# Patient Record
Sex: Female | Born: 1960
Health system: Southern US, Community
[De-identification: ages and names within clinical notes are randomized; demographics above are authoritative.]

## PROBLEM LIST (undated history)

## (undated) DIAGNOSIS — Z87448 Personal history of other diseases of urinary system: Secondary | ICD-10-CM

## (undated) DIAGNOSIS — K219 Gastro-esophageal reflux disease without esophagitis: Secondary | ICD-10-CM

## (undated) DIAGNOSIS — I10 Essential (primary) hypertension: Secondary | ICD-10-CM

## (undated) DIAGNOSIS — M129 Arthropathy, unspecified: Secondary | ICD-10-CM

## (undated) DIAGNOSIS — L03119 Cellulitis of unspecified part of limb: Secondary | ICD-10-CM

## (undated) DIAGNOSIS — E538 Deficiency of other specified B group vitamins: Secondary | ICD-10-CM

## (undated) DIAGNOSIS — R7303 Prediabetes: Secondary | ICD-10-CM

## (undated) DIAGNOSIS — IMO0002 Reserved for concepts with insufficient information to code with codable children: Secondary | ICD-10-CM

## (undated) DIAGNOSIS — M549 Dorsalgia, unspecified: Secondary | ICD-10-CM

## (undated) DIAGNOSIS — Z87898 Personal history of other specified conditions: Secondary | ICD-10-CM

## (undated) DIAGNOSIS — L02419 Cutaneous abscess of limb, unspecified: Secondary | ICD-10-CM

## (undated) DIAGNOSIS — D509 Iron deficiency anemia, unspecified: Secondary | ICD-10-CM

## (undated) HISTORY — DX: Cellulitis of unspecified part of limb: L03.119

## (undated) HISTORY — DX: Essential (primary) hypertension: I10

## (undated) HISTORY — PX: BILATERAL OOPHORECTOMY: SHX1221

## (undated) HISTORY — DX: Dorsalgia, unspecified: M54.9

## (undated) HISTORY — DX: Arthropathy, unspecified: M12.9

## (undated) HISTORY — DX: Reserved for concepts with insufficient information to code with codable children: IMO0002

## (undated) HISTORY — DX: Cutaneous abscess of limb, unspecified: L02.419

## (undated) HISTORY — DX: Gastro-esophageal reflux disease without esophagitis: K21.9

## (undated) HISTORY — DX: Deficiency of other specified B group vitamins: E53.8

## (undated) HISTORY — DX: Prediabetes: R73.03

## (undated) HISTORY — DX: Iron deficiency anemia, unspecified: D50.9

## (undated) HISTORY — DX: Personal history of other specified conditions: Z87.898

## (undated) HISTORY — DX: Personal history of other diseases of urinary system: Z87.448

## (undated) HISTORY — PX: CHOLECYSTECTOMY: SHX55

---

## 1992-08-16 HISTORY — PX: ABDOMINAL HYSTERECTOMY: SHX81

## 1998-06-13 ENCOUNTER — Emergency Department (HOSPITAL_COMMUNITY): Admission: EM | Admit: 1998-06-13 | Discharge: 1998-06-13 | Payer: Self-pay | Admitting: Emergency Medicine

## 1998-06-13 ENCOUNTER — Encounter: Payer: Self-pay | Admitting: Emergency Medicine

## 2000-03-11 ENCOUNTER — Encounter: Payer: Self-pay | Admitting: Family Medicine

## 2000-03-11 ENCOUNTER — Ambulatory Visit (HOSPITAL_COMMUNITY): Admission: RE | Admit: 2000-03-11 | Discharge: 2000-03-11 | Payer: Self-pay | Admitting: Family Medicine

## 2000-03-25 ENCOUNTER — Other Ambulatory Visit: Admission: RE | Admit: 2000-03-25 | Discharge: 2000-03-25 | Payer: Self-pay | Admitting: Internal Medicine

## 2001-11-08 ENCOUNTER — Ambulatory Visit (HOSPITAL_COMMUNITY): Admission: RE | Admit: 2001-11-08 | Discharge: 2001-11-08 | Payer: Self-pay | Admitting: Family Medicine

## 2001-11-08 ENCOUNTER — Encounter: Payer: Self-pay | Admitting: Family Medicine

## 2003-05-02 ENCOUNTER — Ambulatory Visit (HOSPITAL_COMMUNITY): Admission: RE | Admit: 2003-05-02 | Discharge: 2003-05-02 | Payer: Self-pay | Admitting: Family Medicine

## 2003-05-02 ENCOUNTER — Encounter: Payer: Self-pay | Admitting: Family Medicine

## 2004-03-07 ENCOUNTER — Emergency Department (HOSPITAL_COMMUNITY): Admission: EM | Admit: 2004-03-07 | Discharge: 2004-03-07 | Payer: Self-pay | Admitting: Emergency Medicine

## 2005-11-29 ENCOUNTER — Other Ambulatory Visit: Admission: RE | Admit: 2005-11-29 | Discharge: 2005-11-29 | Payer: Self-pay | Admitting: Family Medicine

## 2007-11-13 ENCOUNTER — Ambulatory Visit (HOSPITAL_COMMUNITY): Admission: RE | Admit: 2007-11-13 | Discharge: 2007-11-13 | Payer: Self-pay | Admitting: Family Medicine

## 2008-01-10 ENCOUNTER — Ambulatory Visit: Payer: Self-pay | Admitting: Internal Medicine

## 2008-01-10 LAB — CONVERTED CEMR LAB
ALT: 35 units/L (ref 0–35)
AST: 29 units/L (ref 0–37)
BUN: 11 mg/dL (ref 6–23)
Basophils Absolute: 0 10*3/uL (ref 0.0–0.1)
Basophils Relative: 1.1 % — ABNORMAL HIGH (ref 0.0–1.0)
CO2: 31 meq/L (ref 19–32)
Calcium: 9.4 mg/dL (ref 8.4–10.5)
Chloride: 103 meq/L (ref 96–112)
Cholesterol: 165 mg/dL (ref 0–200)
Creatinine, Ser: 0.8 mg/dL (ref 0.4–1.2)
Eosinophils Relative: 2.3 % (ref 0.0–5.0)
Glucose, Bld: 110 mg/dL — ABNORMAL HIGH (ref 70–99)
Hemoglobin: 12 g/dL (ref 12.0–15.0)
Ketones, ur: NEGATIVE mg/dL
Lymphocytes Relative: 60.5 % — ABNORMAL HIGH (ref 12.0–46.0)
MCHC: 31.8 g/dL (ref 30.0–36.0)
Monocytes Relative: 2.5 % — ABNORMAL LOW (ref 3.0–12.0)
Mucus, UA: NEGATIVE
Neutro Abs: 1.2 10*3/uL — ABNORMAL LOW (ref 1.4–7.7)
Neutrophils Relative %: 33.6 % — ABNORMAL LOW (ref 43.0–77.0)
RBC: 4.94 M/uL (ref 3.87–5.11)
Specific Gravity, Urine: 1.025 (ref 1.000–1.03)
TSH: 1.72 microintl units/mL (ref 0.35–5.50)
Total Bilirubin: 1 mg/dL (ref 0.3–1.2)
Total CHOL/HDL Ratio: 2.8
Total Protein: 7 g/dL (ref 6.0–8.3)
Triglycerides: 64 mg/dL (ref 0–149)
Urobilinogen, UA: 0.2 (ref 0.0–1.0)
WBC: 3.6 10*3/uL — ABNORMAL LOW (ref 4.5–10.5)
pH: 5.5 (ref 5.0–8.0)

## 2008-01-15 ENCOUNTER — Ambulatory Visit: Payer: Self-pay | Admitting: Internal Medicine

## 2008-01-15 ENCOUNTER — Encounter (INDEPENDENT_AMBULATORY_CARE_PROVIDER_SITE_OTHER): Payer: Self-pay | Admitting: *Deleted

## 2008-01-15 DIAGNOSIS — M129 Arthropathy, unspecified: Secondary | ICD-10-CM

## 2008-01-15 DIAGNOSIS — I10 Essential (primary) hypertension: Secondary | ICD-10-CM

## 2008-01-15 DIAGNOSIS — Z87898 Personal history of other specified conditions: Secondary | ICD-10-CM

## 2008-01-15 DIAGNOSIS — Z87448 Personal history of other diseases of urinary system: Secondary | ICD-10-CM

## 2008-01-15 DIAGNOSIS — D509 Iron deficiency anemia, unspecified: Secondary | ICD-10-CM

## 2008-01-15 HISTORY — DX: Personal history of other specified conditions: Z87.898

## 2008-01-15 HISTORY — DX: Arthropathy, unspecified: M12.9

## 2008-01-15 HISTORY — DX: Essential (primary) hypertension: I10

## 2008-01-15 HISTORY — DX: Personal history of other diseases of urinary system: Z87.448

## 2008-01-15 HISTORY — DX: Iron deficiency anemia, unspecified: D50.9

## 2008-01-18 ENCOUNTER — Encounter: Payer: Self-pay | Admitting: Internal Medicine

## 2008-01-18 LAB — CONVERTED CEMR LAB
Transferrin: 276.9 mg/dL (ref >=212.0)
Vitamin B-12: 187 pg/mL — ABNORMAL LOW (ref 211–911)

## 2008-01-24 ENCOUNTER — Ambulatory Visit: Payer: Self-pay | Admitting: Internal Medicine

## 2008-02-26 ENCOUNTER — Ambulatory Visit: Payer: Self-pay | Admitting: Internal Medicine

## 2008-03-28 ENCOUNTER — Ambulatory Visit: Payer: Self-pay | Admitting: Internal Medicine

## 2008-04-25 ENCOUNTER — Ambulatory Visit: Payer: Self-pay | Admitting: Internal Medicine

## 2008-04-25 DIAGNOSIS — L02419 Cutaneous abscess of limb, unspecified: Secondary | ICD-10-CM

## 2008-04-25 DIAGNOSIS — L03119 Cellulitis of unspecified part of limb: Secondary | ICD-10-CM

## 2008-04-25 HISTORY — DX: Cellulitis of unspecified part of limb: L03.119

## 2008-04-25 HISTORY — DX: Cutaneous abscess of limb, unspecified: L02.419

## 2008-04-29 ENCOUNTER — Ambulatory Visit: Payer: Self-pay | Admitting: Internal Medicine

## 2008-05-16 HISTORY — PX: OTHER SURGICAL HISTORY: SHX169

## 2008-06-03 ENCOUNTER — Ambulatory Visit: Payer: Self-pay | Admitting: Internal Medicine

## 2008-06-03 ENCOUNTER — Telehealth (INDEPENDENT_AMBULATORY_CARE_PROVIDER_SITE_OTHER): Payer: Self-pay | Admitting: *Deleted

## 2008-06-11 ENCOUNTER — Ambulatory Visit (HOSPITAL_BASED_OUTPATIENT_CLINIC_OR_DEPARTMENT_OTHER): Admission: RE | Admit: 2008-06-11 | Discharge: 2008-06-11 | Payer: Self-pay | Admitting: Orthopedic Surgery

## 2008-06-11 ENCOUNTER — Encounter (INDEPENDENT_AMBULATORY_CARE_PROVIDER_SITE_OTHER): Payer: Self-pay | Admitting: Orthopedic Surgery

## 2008-07-04 ENCOUNTER — Ambulatory Visit: Payer: Self-pay | Admitting: Internal Medicine

## 2008-07-25 ENCOUNTER — Ambulatory Visit: Payer: Self-pay | Admitting: Internal Medicine

## 2008-08-12 ENCOUNTER — Ambulatory Visit: Payer: Self-pay | Admitting: Internal Medicine

## 2008-09-12 ENCOUNTER — Ambulatory Visit: Payer: Self-pay | Admitting: Internal Medicine

## 2008-10-09 ENCOUNTER — Ambulatory Visit: Payer: Self-pay | Admitting: Internal Medicine

## 2008-10-29 ENCOUNTER — Telehealth (INDEPENDENT_AMBULATORY_CARE_PROVIDER_SITE_OTHER): Payer: Self-pay | Admitting: *Deleted

## 2008-11-11 ENCOUNTER — Ambulatory Visit: Payer: Self-pay | Admitting: Internal Medicine

## 2008-11-14 ENCOUNTER — Ambulatory Visit (HOSPITAL_COMMUNITY): Admission: RE | Admit: 2008-11-14 | Discharge: 2008-11-14 | Payer: Self-pay | Admitting: Internal Medicine

## 2008-12-11 ENCOUNTER — Ambulatory Visit: Payer: Self-pay | Admitting: Internal Medicine

## 2009-01-08 ENCOUNTER — Ambulatory Visit: Payer: Self-pay | Admitting: Internal Medicine

## 2009-02-07 ENCOUNTER — Ambulatory Visit: Payer: Self-pay | Admitting: Internal Medicine

## 2009-02-07 DIAGNOSIS — M549 Dorsalgia, unspecified: Secondary | ICD-10-CM

## 2009-02-07 HISTORY — DX: Dorsalgia, unspecified: M54.9

## 2009-02-10 ENCOUNTER — Encounter: Payer: Self-pay | Admitting: Internal Medicine

## 2009-02-10 LAB — CONVERTED CEMR LAB
AST: 18 units/L (ref 0–37)
Alkaline Phosphatase: 79 units/L (ref 39–117)
BUN: 16 mg/dL (ref 6–23)
Basophils Absolute: 0 10*3/uL (ref 0.0–0.1)
Bilirubin Urine: NEGATIVE
Bilirubin, Direct: 0.1 mg/dL (ref 0.0–0.3)
CO2: 33 meq/L — ABNORMAL HIGH (ref 19–32)
Chloride: 105 meq/L (ref 96–112)
HCT: 35 % — ABNORMAL LOW (ref 36.0–46.0)
HDL: 45.6 mg/dL (ref >39.00)
Ketones, ur: NEGATIVE mg/dL
LDL Cholesterol: 60 mg/dL (ref 0–99)
Leukocytes, UA: NEGATIVE
MCHC: 32.4 g/dL (ref 30.0–36.0)
MCV: 77.2 fL — ABNORMAL LOW (ref 78.0–100.0)
Neutro Abs: 2.6 10*3/uL (ref 1.4–7.7)
Nitrite: NEGATIVE
Potassium: 3.4 meq/L — ABNORMAL LOW (ref 3.5–5.1)
RBC: 4.54 M/uL (ref 3.87–5.11)
TSH: 1.5 microintl units/mL (ref 0.35–5.50)
Total Bilirubin: 0.8 mg/dL (ref 0.3–1.2)
Total CHOL/HDL Ratio: 3
VLDL: 36 mg/dL (ref 0.0–40.0)
WBC: 5.7 10*3/uL (ref 4.5–10.5)

## 2009-04-14 ENCOUNTER — Telehealth: Payer: Self-pay | Admitting: Internal Medicine

## 2009-04-16 ENCOUNTER — Encounter: Payer: Self-pay | Admitting: Internal Medicine

## 2009-06-18 ENCOUNTER — Ambulatory Visit: Payer: Self-pay | Admitting: Internal Medicine

## 2009-07-13 ENCOUNTER — Emergency Department (HOSPITAL_COMMUNITY): Admission: EM | Admit: 2009-07-13 | Discharge: 2009-07-13 | Payer: Self-pay | Admitting: Emergency Medicine

## 2009-11-21 ENCOUNTER — Ambulatory Visit (HOSPITAL_COMMUNITY): Admission: RE | Admit: 2009-11-21 | Discharge: 2009-11-21 | Payer: Self-pay | Admitting: Internal Medicine

## 2010-03-06 ENCOUNTER — Telehealth: Payer: Self-pay | Admitting: Internal Medicine

## 2010-03-26 ENCOUNTER — Ambulatory Visit: Payer: Self-pay | Admitting: Internal Medicine

## 2010-03-26 ENCOUNTER — Telehealth: Payer: Self-pay | Admitting: Internal Medicine

## 2010-03-26 DIAGNOSIS — E538 Deficiency of other specified B group vitamins: Secondary | ICD-10-CM

## 2010-03-26 HISTORY — DX: Deficiency of other specified B group vitamins: E53.8

## 2010-09-09 ENCOUNTER — Other Ambulatory Visit: Payer: Self-pay | Admitting: Internal Medicine

## 2010-09-09 ENCOUNTER — Ambulatory Visit
Admission: RE | Admit: 2010-09-09 | Discharge: 2010-09-09 | Payer: Self-pay | Source: Home / Self Care | Attending: Internal Medicine | Admitting: Internal Medicine

## 2010-09-09 LAB — CBC WITH DIFFERENTIAL/PLATELET
Basophils Relative: 1 % (ref 0.0–3.0)
Eosinophils Absolute: 0.1 10*3/uL (ref 0.0–0.7)
HCT: 36.8 % (ref 36.0–46.0)
Hemoglobin: 12.1 g/dL (ref 12.0–15.0)
Lymphs Abs: 1.9 10*3/uL (ref 0.7–4.0)
MCHC: 33 g/dL (ref 30.0–36.0)
MCV: 76 fl — ABNORMAL LOW (ref 78.0–100.0)
Monocytes Absolute: 0.3 10*3/uL (ref 0.1–1.0)
Neutro Abs: 1.6 10*3/uL (ref 1.4–7.7)
Neutrophils Relative %: 41.9 % — ABNORMAL LOW (ref 43.0–77.0)
RBC: 4.84 Mil/uL (ref 3.87–5.11)

## 2010-09-09 LAB — BASIC METABOLIC PANEL
CO2: 32 mEq/L (ref 19–32)
Chloride: 104 mEq/L (ref 96–112)
Creatinine, Ser: 0.8 mg/dL (ref 0.4–1.2)
Potassium: 3.8 mEq/L (ref 3.5–5.1)

## 2010-09-09 LAB — HEPATIC FUNCTION PANEL
ALT: 22 U/L (ref 0–35)
Albumin: 4.2 g/dL (ref 3.5–5.2)
Total Protein: 7.2 g/dL (ref 6.0–8.3)

## 2010-09-09 LAB — URINALYSIS, ROUTINE W REFLEX MICROSCOPIC
Ketones, ur: NEGATIVE
Specific Gravity, Urine: 1.02 (ref 1.000–1.030)
Urine Glucose: NEGATIVE
pH: 5.5 (ref 5.0–8.0)

## 2010-09-09 LAB — TSH: TSH: 1.68 u[IU]/mL (ref 0.35–5.50)

## 2010-09-09 LAB — LIPID PANEL
Cholesterol: 163 mg/dL (ref 0–200)
Triglycerides: 50 mg/dL (ref 0.0–149.0)

## 2010-09-17 ENCOUNTER — Ambulatory Visit: Admit: 2010-09-17 | Payer: Self-pay | Admitting: Internal Medicine

## 2010-09-17 ENCOUNTER — Other Ambulatory Visit: Payer: Self-pay

## 2010-09-17 NOTE — Letter (Signed)
Summary: Forms/NCDMV  Forms/NCDMV   Imported By: Sherian Rein 03/30/2010 14:39:07  _____________________________________________________________________  External Attachment:    Type:   Image     Comment:   External Document

## 2010-09-17 NOTE — Progress Notes (Signed)
----   Converted from flag ---- ---- 03/26/2010 12:39 PM, Corwin Levins MD wrote: I would prefer her to have the shots monthly, but she could hold a few months if she were to have insurance in the future  ---- 03/26/2010 8:49 AM, Zella Ball Ewing CMA (AAMA) wrote: Patient wants to know if you want her in the future to continue B-12 shots. She had one in November. I told her I would call her after speaking to you about this. She did not have insurance and is why she did not continue. ------------------------------  called pt left msg. to call back    Patient called back informed to schedule monthly B-12 shots once she has insurance, patient agreed to do so.

## 2010-09-17 NOTE — Progress Notes (Signed)
Summary: BP med  ---- Converted from flag ---- ---- 03/06/2010 11:14 AM, Verdell Face wrote: [name]  (Dec 23, 2060) Pt filled out a walk-in sheet. She was admitted to [facility] hospital for a bad virus. Dr.,at hospital changed blood pressure meds to more affordable brands (Hydrochlorothiazide 25 mg,Lisinopril 20 mg) Pt cannot make appt until end of September but needs a refill for next month.  [facility] [employer], Elnita Maxwell ------------------------------  Phone Note Call from Patient Call back at [employer] [phone]   Caller: Patient Summary of Call: Okay to change BP and give 30 day with 1 refill? I will advise pt that there will be no additional refill until CPX. Initial call taken by: Margaret Pyle, CMA,  March 06, 2010 11:49 AM  Follow-up for Phone Call        ok with me Follow-up by: Corwin Levins MD,  March 06, 2010 12:46 PM  Additional Follow-up for Phone Call Additional follow up Details #1::        Pt's spouse informed of Rxs and that pt will need to sch CPX for further refills. Additional Follow-up by: Margaret Pyle, CMA,  March 06, 2010 3:34 PM    New/Updated Medications: LISINOPRIL 20 MG TABS (LISINOPRIL) 1 tab by mouth once daily HYDROCHLOROTHIAZIDE 25 MG TABS (HYDROCHLOROTHIAZIDE) 1 tab by mouth once daily Prescriptions: HYDROCHLOROTHIAZIDE 25 MG TABS (HYDROCHLOROTHIAZIDE) 1 tab by mouth once daily  #30 x 1   Entered by:   Margaret Pyle, CMA   Authorized by:   Corwin Levins MD   Signed by:   Margaret Pyle, CMA on 03/06/2010   Method used:   Electronically to        [facility] [phone]* (retail)       [address]       [place], [place]  [address]       Ph: [phone]       Fax: [phone]   RxID:   [id number] LISINOPRIL 20 MG TABS (LISINOPRIL) 1 tab by mouth once daily  #30 x 1   Entered by:   Margaret Pyle, CMA   Authorized by:   Corwin Levins MD   Signed by:   Margaret Pyle, CMA on 03/06/2010   Method used:   Electronically to        [facility] [phone]* (retail)       [address]       [place], [place]  [address]       Ph: [phone]       Fax: [phone]   RxID:   [id number]

## 2010-09-17 NOTE — Assessment & Plan Note (Signed)
Summary: ov---paperwork---stc   Vital Signs:  Patient profile:   50 year old female Height:      63 inches Weight:      198.75 pounds BMI:     35.33 O2 Sat:      99 % on Room air Temp:     98 degrees F oral Pulse rate:   58 / minute BP sitting:   110 / 68  (left arm) Cuff size:   large  Vitals Entered By: Zella Ball Ewing CMA Duncan Dull) (March 26, 2010 8:10 AM)  O2 Flow:  Room air  Preventive Care Screening  Mammogram:    Next Due:  11/2010  CC: followup, complete paperwork/RE   CC:  followup and complete paperwork/RE.  History of Present Illness: hospd in june 2011 for HTN urgency in [facility]; since then on more affordable med after losing job and unable to afford BP meds;Pt denies CP, sob, doe, wheezing, orthopnea, pnd, worsening LE edema, palps, dizziness or syncope  Pt denies new neuro symptoms such as headache, facial or extremity weakness  No fever, wt loss, night sweats, loss of appetite or other constitutional symptoms   Preventive Screening-Counseling & Management      Drug Use:  no.    Problems Prior to Update: 1)  Preventive Health Care  (ICD-V70.0) 2)  Back Pain  (ICD-724.5) 3)  Cellulitis, Leg, Left  (ICD-682.6) 4)  Anemia-iron Deficiency  (ICD-280.9) 5)  Preventive Health Care  (ICD-V70.0) 6)  Uti's, Hx of  (ICD-V13.00) 7)  Headaches, Hx of  (ICD-V13.8) 8)  Arthritis  (ICD-716.90) 9)  Hypertension  (ICD-401.9)  Medications Prior to Update: 1)  Lisinopril 20 Mg Tabs (Lisinopril) .Marland Kitchen.. 1 Tab By Mouth Once Daily 2)  Amlodipine Besylate 5 Mg Tabs (Amlodipine Besylate) .Marland Kitchen.. 1 By Mouth Once Daily 3)  Meloxicam 15 Mg Tabs (Meloxicam) .Marland Kitchen.. 1 By Mouth Once Daily 4)  Aspir-Low 81 Mg Tbec (Aspirin) .Marland Kitchen.. 1 By Mouth Once Daily 5)  Klor-Con 10 10 Meq Cr-Tabs (Potassium Chloride) .Marland Kitchen.. 1 By Mouth Once Daily 6)  Hydrochlorothiazide 25 Mg Tabs (Hydrochlorothiazide) .Marland Kitchen.. 1 Tab By Mouth Once Daily  Current Medications (verified): 1)  Lisinopril 20 Mg Tabs (Lisinopril)  .Marland Kitchen.. 1 Tab By Mouth Once Daily 2)  Klor-Con 10 10 Meq Cr-Tabs (Potassium Chloride) .Marland Kitchen.. 1 By Mouth Once Daily 3)  Hydrochlorothiazide 25 Mg Tabs (Hydrochlorothiazide) .Marland Kitchen.. 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 Hypertension Anemia-iron deficiency right heel spur lumbar DDD  Past Surgical History: Reviewed history from 07/25/2008 and no changes required. Hysterectomy - fibroids - 1994 Oophorectomy - bilat s/p arthroscopic right knee surgury 10/09 - dr Renae Fickle  Family History: Reviewed history from 01/15/2008 and no changes required. father with RA, HTN, DM, stroke mother with OA, HTN, DM, heart disease  aunt with "stomach" cancer  Social History: Reviewed history from 01/15/2008 and no changes required. 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   Head:  normocephalic and atraumatic.   Eyes:  vision grossly intact, pupils equal, and pupils  round.   Ears:  R ear normal and L ear normal.   Nose:  no external deformity and no nasal discharge.   Mouth:  no gingival abnormalities and pharynx pink and moist.   Neck:  supple and no masses.   Lungs:  normal respiratory effort and normal breath sounds.   Heart:  normal rate and regular rhythm.   Extremities:  no edema, no erythema    Impression & Recommendations:  Problem # 1:  HYPERTENSION (ICD-401.9)  The following medications were removed from the medication list:    Amlodipine Besylate 5 Mg Tabs (Amlodipine besylate) .Marland Kitchen... 1 by mouth  once daily Her updated medication list for this problem includes:    Lisinopril 20 Mg Tabs (Lisinopril) .Marland Kitchen... 1 tab by mouth once daily    Hydrochlorothiazide 25 Mg Tabs (Hydrochlorothiazide) .Marland Kitchen... 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 1 tab by mouth once daily 2)  Klor-con 10 10 Meq Cr-tabs (Potassium chloride) .Marland Kitchen.. 1 by mouth once daily 3)  Hydrochlorothiazide 25 Mg Tabs (Hydrochlorothiazide) .Marland Kitchen.. 1 tab by mouth once daily  Patient Instructions: 1)  Continue all previous medications as before this visit  2)  Your form was filled out today 3)  Please schedule a follow-up appointment in 6 months wtih CPX labs Prescriptions: HYDROCHLOROTHIAZIDE 25 MG TABS (HYDROCHLOROTHIAZIDE) 1 tab by mouth once daily  #90 x 3   Entered and Authorized by:   Corwin Levins MD   Signed by:   Corwin Levins MD on 03/26/2010   Method used:   Print then Give to Patient   RxID:   [id number] KLOR-CON 10 10 MEQ CR-TABS (POTASSIUM CHLORIDE) 1 by mouth once daily  #90 x 3   Entered and Authorized by:   Corwin Levins MD   Signed by:   Corwin Levins MD on 03/26/2010   Method used:   Print then Give to Patient   RxID:   [id number] LISINOPRIL 20 MG TABS (LISINOPRIL) 1 tab by mouth once daily  #90 x 3   Entered and Authorized by:   Corwin Levins MD   Signed by:   Corwin Levins MD on 03/26/2010   Method used:   Print then Give to Patient   RxID:   [id number]

## 2010-09-24 ENCOUNTER — Encounter: Payer: Self-pay | Admitting: Internal Medicine

## 2010-09-24 ENCOUNTER — Ambulatory Visit (INDEPENDENT_AMBULATORY_CARE_PROVIDER_SITE_OTHER): Payer: BC Managed Care – PPO | Admitting: Internal Medicine

## 2010-09-24 DIAGNOSIS — Z136 Encounter for screening for cardiovascular disorders: Secondary | ICD-10-CM

## 2010-09-24 DIAGNOSIS — K219 Gastro-esophageal reflux disease without esophagitis: Secondary | ICD-10-CM

## 2010-09-24 DIAGNOSIS — E538 Deficiency of other specified B group vitamins: Secondary | ICD-10-CM

## 2010-09-24 DIAGNOSIS — Z0000 Encounter for general adult medical examination without abnormal findings: Secondary | ICD-10-CM

## 2010-09-24 HISTORY — DX: Gastro-esophageal reflux disease without esophagitis: K21.9

## 2010-10-01 NOTE — Assessment & Plan Note (Signed)
Summary: 6 MOS FU/#CD   Vital Signs:  Patient profile:   50 year old female Height:      66 inches Weight:      204.25 pounds BMI:     33.09 O2 Sat:      97 % on Room air Temp:     98.1 degrees F oral Pulse rate:   74 / minute BP sitting:   102 / 70  (left arm) Cuff size:   large  Vitals Entered By: Zella Ball Ewing CMA Duncan Dull) (September 24, 2010 9:52 AM)  O2 Flow:  Room air  Preventive Care Screening     declines flu shot  CC: Yearly/RE   CC:  Yearly/RE.  History of Present Illness: here for wellness, overall doing well, Pt denies CP, worsening sob, doe, wheezing, orthopnea, pnd, worsening LE edema, palps, dizziness or syncope  Pt denies new neuro symptoms such as headache, facial or extremity weakness  Pt denies polydipsia, polyuria   Overall good compliance with meds, trying to follow low chol diet, wt stable, little excercise however  No fever, wt loss, night sweats, loss of appetite or other constitutional symptoms  Overall good compliance with meds, and good tolerability.  Denies worsening depressive symptoms, suicidal ideation, or panic.   Pt states good ability with ADL's, low fall risk, home safety reviewed and adequate, no significant change in hearing or vision, trying to follow lower chol diet, and occasionally active only with regular excercise.   Did have hospn for CP in [place] recently with flare of GERD, better now with dietary precuations, but stil wtih occasional flare, but no cough, hoarseness, dysphgia, abd pain, wt loss, or bowel change, or blood.   Problems Prior to Update: 1)  Vitamin B12 Deficiency  (ICD-266.2) 2)  Preventive Health Care  (ICD-V70.0) 3)  Back Pain  (ICD-724.5) 4)  Cellulitis, Leg, Left  (ICD-682.6) 5)  Anemia-iron Deficiency  (ICD-280.9) 6)  Preventive Health Care  (ICD-V70.0) 7)  Uti's, Hx of  (ICD-V13.00) 8)  Headaches, Hx of  (ICD-V13.8) 9)  Arthritis  (ICD-716.90) 10)  Hypertension  (ICD-401.9)  Medications Prior to Update: 1)   Lisinopril 20 Mg Tabs (Lisinopril) .Marland Kitchen.. 1 Tab By Mouth Once Daily 2)  Klor-Con 10 10 Meq Cr-Tabs (Potassium Chloride) .Marland Kitchen.. 1 By Mouth Once Daily 3)  Hydrochlorothiazide 25 Mg Tabs (Hydrochlorothiazide) .Marland Kitchen.. 1 Tab By Mouth Once Daily 4)  Cyanocobalamin 1000 Mcg/ml Soln (Cyanocobalamin) .Marland Kitchen.. 1 Cc Im Q Mo  Current Medications (verified): 1)  Lisinopril 20 Mg Tabs (Lisinopril) .Marland Kitchen.. 1 Tab By Mouth Once Daily 2)  Klor-Con 10 10 Meq Cr-Tabs (Potassium Chloride) .Marland Kitchen.. 1 By Mouth Once Daily 3)  Hydrochlorothiazide 25 Mg Tabs (Hydrochlorothiazide) .Marland Kitchen.. 1 Tab By Mouth Once Daily 4)  Cyanocobalamin 1000 Mcg/ml Soln (Cyanocobalamin) .Marland Kitchen.. 1 Cc Im Q Mo 5)  Omeprazole 20 Mg Cpdr (Omeprazole) .Marland Kitchen.. 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 father with RA, HTN, DM, stroke mother with OA, HTN, DM, heart disease, renal disease  aunt with "stomach" cancer  Social History: Reviewed history from 03/26/2010 and no changes required. bus driver for private students Married 2 children Never Smoked Alcohol use-no Drug use-no  Review of Systems  The patient denies anorexia, fever, vision loss, decreased hearing, hoarseness, chest pain, syncope, dyspnea on exertion, peripheral edema, prolonged cough, headaches,  hemoptysis, abdominal pain, melena, hematochezia, severe indigestion/heartburn, hematuria, incontinence, suspicious skin lesions, transient blindness, difficulty walking, depression, unusual weight change, abnormal bleeding, enlarged lymph nodes, and angioedema.         all otherwise negative per pt -    Physical Exam  General:  alert and overweight-appearing.   Head:  normocephalic and atraumatic.   Eyes:  vision grossly intact, pupils equal, and pupils round.   Ears:  R ear normal and L ear normal.   Nose:  no external deformity and no nasal discharge.   Mouth:  no gingival abnormalities and pharynx pink and moist.   Neck:  supple and no masses.   Lungs:  normal respiratory effort and normal breath sounds.   Heart:  normal rate and regular rhythm.   Abdomen:  soft, non-tender, and normal bowel sounds.   Msk:  no joint tenderness and no joint swelling. , lumbar and paravertebral nontender Extremities:  no edema, no erythema  Neurologic:  cranial nerves II-XII intact, strength normal in all extremities, and sensation intact to light touch.   Skin:  color normal and no rashes.   Psych:  not anxious appearing and not depressed appearing.     Impression & Recommendations:  Problem # 1:  Preventive Health Care (ICD-V70.0) Overall doing well, age appropriate education and counseling updated, referral for preventive services and immunizations addressed, dietary counseling and smoking status adressed , most recent labs reviewed, ecg reviewed I have personally reviewed and have noted 1.The patient's medical and social history 2.Their use of alcohol, tobacco or illicit drugs 3.Their current medications and supplements 4. Functional ability including ADL's, fall risk, home safety risk, hearing & visual impairment  5.Diet and physical activities 6.Evidence for depression or mood disorders The patients weight, height, BMI  have been recorded in the chart I have made referrals, counseling and  provided education to the patient based review of the above  Orders: EKG w/ Interpretation (93000) Gastroenterology Referral (GI)  Problem # 2:  GERD (ICD-530.81)  Her updated medication list for this problem includes:    Omeprazole 20 Mg Cpdr (Omeprazole) .Marland Kitchen... 1po once daily treat as above, f/u any worsening signs or symptoms   Labs Reviewed: Hgb: 12.1 (09/09/2010)   Hct: 36.8 (09/09/2010)  Problem # 3:  HYPERTENSION (ICD-401.9)  Orders: EKG w/ Interpretation (93000) Gastroenterology Referral (GI)  Her updated medication list for this problem includes:    Lisinopril 20 Mg Tabs (Lisinopril) .Marland Kitchen... 1 tab by mouth once daily    Hydrochlorothiazide 25 Mg Tabs (Hydrochlorothiazide) .Marland Kitchen... 1 tab by mouth once daily  BP today: 102/70 Prior BP: 110/68 (03/26/2010)  Labs Reviewed: K+: 3.8 (09/09/2010) Creat: : 0.8 (09/09/2010)   Chol: 163 (09/09/2010)   HDL: 57.10 (09/09/2010)   LDL: 96 (09/09/2010)   TG: 50.0 (09/09/2010) stable overall by hx and exam, ok to continue meds/tx as is   Complete Medication List: 1)  Lisinopril 20 Mg Tabs (Lisinopril) .Marland Kitchen.. 1 tab by mouth once daily 2)  Klor-con 10 10 Meq Cr-tabs (Potassium chloride) .Marland Kitchen.. 1 by mouth once daily  3)  Hydrochlorothiazide 25 Mg Tabs (Hydrochlorothiazide) .Marland Kitchen.. 1 tab by mouth once daily 4)  Cyanocobalamin 1000 Mcg/ml Soln (Cyanocobalamin) .Marland Kitchen.. 1 cc im q mo 5)  Omeprazole 20 Mg Cpdr (Omeprazole) .Marland Kitchen.. 1po once daily  Other Orders: Vit B12 1000 mcg (J3420) Admin of Therapeutic Inj  intramuscular or subcutaneous (13244)  Patient Instructions: 1)  you had the b12 shot today 2)  You will be contacted about the referral(s) to: colonoscopy 3)  Please take all new medications as prescribed - the generic for prilosec for reflux 4)  Continue all previous medications as before this visit, including the B12 shots 5)  Please schedule a follow-up appointment in 1 year, or sooner if needed Prescriptions: LISINOPRIL 20 MG TABS  (LISINOPRIL) 1 tab by mouth once daily  #90 x 3   Entered and Authorized by:   Corwin Levins MD   Signed by:   Corwin Levins MD on 09/24/2010   Method used:   Print then Give to Patient   RxID:   [id number] KLOR-CON 10 10 MEQ CR-TABS (POTASSIUM CHLORIDE) 1 by mouth once daily  #90 x 3   Entered and Authorized by:   Corwin Levins MD   Signed by:   Corwin Levins MD on 09/24/2010   Method used:   Print then Give to Patient   RxID:   [id number] HYDROCHLOROTHIAZIDE 25 MG TABS (HYDROCHLOROTHIAZIDE) 1 tab by mouth once daily  #90 x 3   Entered and Authorized by:   Corwin Levins MD   Signed by:   Corwin Levins MD on 09/24/2010   Method used:   Print then Give to Patient   RxID:   [id number] OMEPRAZOLE 20 MG CPDR (OMEPRAZOLE) 1po once daily  #90 x 3   Entered and Authorized by:   Corwin Levins MD   Signed by:   Corwin Levins MD on 09/24/2010   Method used:   Print then Give to Patient   RxID:   [id number]    Medication Administration  Injection # 1:    Medication: Vit B12 1000 mcg    Diagnosis: VITAMIN B12 DEFICIENCY (ICD-266.2)    Route: IM    Site: L deltoid    Exp Date: 06/2012    Lot #: [id number]    Mfr: [employer]    Patient tolerated injection without complications    Given by: Zella Ball Ewing CMA Duncan Dull) (September 24, 2010 9:53 AM)  Orders Added: 1)  EKG w/ Interpretation [93000] 2)  Vit B12 1000 mcg [J3420] 3)  Admin of Therapeutic Inj  intramuscular or subcutaneous [96372] 4)  Gastroenterology Referral [GI]

## 2010-10-20 ENCOUNTER — Ambulatory Visit (INDEPENDENT_AMBULATORY_CARE_PROVIDER_SITE_OTHER): Payer: BC Managed Care – PPO | Admitting: Internal Medicine

## 2010-10-20 ENCOUNTER — Encounter: Payer: Self-pay | Admitting: Internal Medicine

## 2010-10-20 DIAGNOSIS — K219 Gastro-esophageal reflux disease without esophagitis: Secondary | ICD-10-CM

## 2010-10-20 DIAGNOSIS — E538 Deficiency of other specified B group vitamins: Secondary | ICD-10-CM

## 2010-10-20 DIAGNOSIS — I10 Essential (primary) hypertension: Secondary | ICD-10-CM

## 2010-10-20 DIAGNOSIS — IMO0002 Reserved for concepts with insufficient information to code with codable children: Secondary | ICD-10-CM

## 2010-10-20 HISTORY — DX: Reserved for concepts with insufficient information to code with codable children: IMO0002

## 2010-10-21 ENCOUNTER — Other Ambulatory Visit (HOSPITAL_COMMUNITY): Payer: Self-pay | Admitting: *Deleted

## 2010-10-21 DIAGNOSIS — IMO0002 Reserved for concepts with insufficient information to code with codable children: Secondary | ICD-10-CM

## 2010-10-22 ENCOUNTER — Ambulatory Visit: Payer: BC Managed Care – PPO | Admitting: Internal Medicine

## 2010-10-27 NOTE — Assessment & Plan Note (Signed)
Summary: LOWER BACK PAIN/CANT STAND FOR 30 MIN/WEAKNESS IN LEFT LEG/LB   Vital Signs:  Patient profile:   50 year old female Height:      63 inches Weight:      208.25 pounds BMI:     37.02 O2 Sat:      99 % on Room air Temp:     98.2 degrees F oral Pulse rate:   78 / minute BP sitting:   106 / 74  (left arm) Cuff size:   large  Vitals Entered By: Zella Ball Ewing CMA Duncan Dull) (October 20, 2010 9:57 AM)  O2 Flow:  Room air CC: Back pain, B-12/RE   CC:  Back pain and B-12/RE.  History of Present Illness: here to f/yu with acute - c/o 2 wks onset gradaully worsenin now severe LBP , with some LLE radicular pain, numb and tingling, adn weakness to the point whrere she would have to help pick up the leg to get in to a car;  no bowel or bladder changes but did have some soreness to the upper abd,  has not tried the prilosec yet.  Has had some difficulty walking, but no falls. 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  nothing else makes better, and not tried the tylenol arthritis yet but plans to.  No falls or injury, no fever, wt loss.  Last episode back was yrs ago, had MRI with LS spine DDD. No prior surgury, ESI or surgical eval or PT. Right handed Still with some reflux symtpoms, but no dysphagia, n/v,  other abd pain, blood.  Pt denies CP, worsening sob, doe, wheezing, orthopnea, pnd, worsening LE edema, palps, dizziness or syncope  Pt denies new neuro symptoms such as headache, facial or extremity weakness  Pt denies polydipsia, polyuria  Problems Prior to Update: 1)  Gerd  (ICD-530.81) 2)  Vitamin B12 Deficiency  (ICD-266.2) 3)  Preventive Health Care  (ICD-V70.0) 4)  Back Pain  (ICD-724.5) 5)  Cellulitis, Leg, Left  (ICD-682.6) 6)  Anemia-iron Deficiency  (ICD-280.9) 7)  Preventive Health Care  (ICD-V70.0) 8)  Uti's, Hx of  (ICD-V13.00) 9)  Headaches,  Hx of  (ICD-V13.8) 10)  Arthritis  (ICD-716.90) 11)  Hypertension  (ICD-401.9)  Medications Prior to Update: 1)  Lisinopril 20 Mg Tabs (Lisinopril) .Marland Kitchen.. 1 Tab By Mouth Once Daily 2)  Klor-Con 10 10 Meq Cr-Tabs (Potassium Chloride) .Marland Kitchen.. 1 By Mouth Once Daily 3)  Hydrochlorothiazide 25 Mg Tabs (Hydrochlorothiazide) .Marland Kitchen.. 1 Tab By Mouth Once Daily 4)  Cyanocobalamin 1000 Mcg/ml Soln (Cyanocobalamin) .Marland Kitchen.. 1 Cc Im Q Mo 5)  Omeprazole 20 Mg Cpdr (Omeprazole) .Marland Kitchen.. 1po Once Daily  Current Medications (verified): 1)  Lisinopril 20 Mg Tabs (Lisinopril) .Marland Kitchen.. 1 Tab By Mouth Once Daily 2)  Klor-Con 10 10 Meq Cr-Tabs (Potassium Chloride) .Marland Kitchen.. 1 By Mouth Once Daily 3)  Hydrochlorothiazide 25 Mg Tabs (Hydrochlorothiazide) .Marland Kitchen.. 1 Tab By Mouth Once Daily 4)  Cyanocobalamin 1000 Mcg/ml Soln (Cyanocobalamin) .Marland Kitchen.. 1 Cc Im Q Mo 5)  Omeprazole 20 Mg Cpdr (Omeprazole) .Marland Kitchen.. 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   Head:  normocephalic and atraumatic.   Eyes:  vision grossly intact, pupils equal, and pupils round.   Ears:  R ear normal and L ear normal.   Nose:  no external deformity and no nasal discharge.   Mouth:  no gingival abnormalities.   Neck:  supple and no masses.   Lungs:  normal respiratory effort and normal breath sounds.   Heart:  normal rate and regular rhythm.   Abdomen:  soft,  non-tender, and normal bowel sounds.   Msk:  spine essentially nontender and no signficant paravertebral tender or spasm Extremities:  no edema, no erythema  Neurologic:  cranial nerves II-XII intact, strength normal in all extremities, sensation intact to light touch, and DTRs symmetrical and normal.  except for decreased sensation to LT to left ant thigh, and 4+ 5 diffuse leg weakness; neg SLR bilat   Impression & Recommendations:  Problem # 1:  LUMBAR RADICULOPATHY, LEFT (ICD-724.4)  mod to severe symptoms and newly abnormal neuro exam c/w above;  will ask for LS spine MRI, pain med, and refer to NS;  pt cont's to work by her choice  Orders: Radiology Referral (Radiology) Neurosurgeon Referral (Neurosurgeon)  Her updated medication list for this problem includes:    Hydrocodone-acetaminophen 7.5-325 Mg Tabs (Hydrocodone-acetaminophen) .Marland Kitchen... 1 by mouth four times per day as needed pain  Problem # 2:  GERD (ICD-530.81)  Her updated medication list for this problem includes:    Omeprazole 20 Mg Cpdr (Omeprazole) .Marland Kitchen... 1po once daily treat as above, f/u any worsening signs or symptoms   Labs Reviewed: Hgb: 12.1 (09/09/2010)   Hct: 36.8 (09/09/2010)  Problem # 3:  VITAMIN B12 DEFICIENCY (ICD-266.2)  Orders: Vit B12 1000 mcg (J3420) Admin of Therapeutic Inj  intramuscular or subcutaneous ([address]) for b12 shot today - stable overall by hx and exam, ok to continue meds/tx as is   Problem # 4:  HYPERTENSION (ICD-401.9)  Her updated medication list for this problem includes:    Lisinopril 20 Mg Tabs (Lisinopril) .Marland Kitchen... 1 tab by mouth once daily    Hydrochlorothiazide 25 Mg Tabs (Hydrochlorothiazide) .Marland Kitchen... 1 tab by mouth once daily  BP today: 106/74 Prior BP: 102/70 (09/24/2010)  Labs Reviewed: K+: 3.8 (09/09/2010) Creat: : 0.8 (09/09/2010)   Chol: 163 (09/09/2010)   HDL: 57.10 (09/09/2010)   LDL: 96 (09/09/2010)   TG: 50.0 (09/09/2010) stable overall by hx and exam, ok to  continue meds/tx as is   Complete Medication List: 1)  Lisinopril 20 Mg Tabs (Lisinopril) .Marland Kitchen.. 1 tab by mouth once daily 2)  Klor-con 10 10 Meq Cr-tabs (Potassium chloride) .Marland Kitchen.. 1 by mouth once daily 3)  Hydrochlorothiazide 25 Mg Tabs (Hydrochlorothiazide) .Marland Kitchen.. 1 tab by mouth once daily 4)  Cyanocobalamin 1000 Mcg/ml Soln (Cyanocobalamin) .Marland Kitchen.. 1 cc im q mo 5)  Omeprazole 20 Mg Cpdr (Omeprazole) .Marland Kitchen.. 1po once daily 6)  Hydrocodone-acetaminophen 7.5-325 Mg Tabs (Hydrocodone-acetaminophen) .Marland Kitchen.. 1 by mouth four times per day as needed pain  Patient Instructions: 1)  Please take all new medications as prescribed 2)  Continue all previous medications as before this visit  3)  You will be contacted about the referral(s) to: MRI  for the lower back, and the Neurosurgury appt 4)  Please schedule a follow-up appointment as needed. Prescriptions: HYDROCODONE-ACETAMINOPHEN 7.5-325 MG TABS (HYDROCODONE-ACETAMINOPHEN) 1 by mouth four times per day as needed pain  #100 x 0   Entered and Authorized by:   Corwin Levins MD   Signed by:   Corwin Levins MD on 10/20/2010   Method used:   Print then Give to Patient   RxID:   [id number]    Medication Administration  Injection # 1:    Medication: Vit B12 1000 mcg    Diagnosis: VITAMIN B12 DEFICIENCY (ICD-266.2)    Route: IM    Site: R deltoid    Exp Date: 06/2012    Lot #: [id number]    Mfr: [employer]ent    Patient tolerated injection without complications    Given by: Zella Ball Ewing CMA Duncan Dull) (October 20, 2010 9:59 AM)  Orders Added: 1)  Vit B12 1000 mcg [J3420] 2)  Admin of Therapeutic Inj  intramuscular or subcutaneous [96372] 3)  Radiology Referral [Radiology] 4)  Neurosurgeon Referral [Neurosurgeon] 5)  Est. Patient Level IV [14782]

## 2010-10-28 ENCOUNTER — Telehealth: Payer: Self-pay | Admitting: Internal Medicine

## 2010-10-29 ENCOUNTER — Encounter: Payer: Self-pay | Admitting: Internal Medicine

## 2010-10-29 ENCOUNTER — Ambulatory Visit (HOSPITAL_COMMUNITY)
Admission: RE | Admit: 2010-10-29 | Discharge: 2010-10-29 | Disposition: A | Discharge status: Home / Self Care | Payer: BC Managed Care – PPO | Source: Ambulatory Visit | Attending: *Deleted | Admitting: *Deleted

## 2010-10-29 DIAGNOSIS — IMO0002 Reserved for concepts with insufficient information to code with codable children: Secondary | ICD-10-CM

## 2010-10-29 DIAGNOSIS — M545 Low back pain, unspecified: Secondary | ICD-10-CM | POA: Insufficient documentation

## 2010-10-29 DIAGNOSIS — M47817 Spondylosis without myelopathy or radiculopathy, lumbosacral region: Secondary | ICD-10-CM | POA: Insufficient documentation

## 2010-10-29 DIAGNOSIS — M5126 Other intervertebral disc displacement, lumbar region: Secondary | ICD-10-CM | POA: Insufficient documentation

## 2010-10-29 DIAGNOSIS — M79609 Pain in unspecified limb: Secondary | ICD-10-CM | POA: Insufficient documentation

## 2010-11-03 NOTE — Progress Notes (Signed)
Summary: ?RX  Phone Note Call from Patient   Caller: Patient Summary of Call: Pt states that she has misplaced her rx for B12 and Omeprazole. Pt would like new rx to go to [facility] on [address]. Initial call taken by: Brenton Grills CMA Duncan Dull),  October 28, 2010 3:46 PM  Follow-up for Phone Call        ok for routine to robin Follow-up by: Corwin Levins MD,  October 28, 2010 5:25 PM    Prescriptions: CYANOCOBALAMIN 1000 MCG/ML SOLN (CYANOCOBALAMIN) 1 cc Im q mo  #1 month x 11   Entered by:   Zella Ball Ewing CMA (AAMA)   Authorized by:   Corwin Levins MD   Signed by:   Scharlene Gloss CMA (AAMA) on 10/29/2010   Method used:   Electronically to        [facility] [phone]* (retail)       [address]       [place], [place]  [address]       Ph: [phone]       Fax: [phone]   RxID:   [id number] OMEPRAZOLE 20 MG CPDR (OMEPRAZOLE) 1po once daily  #90 x 3   Entered by:   Scharlene Gloss CMA (AAMA)   Authorized by:   Corwin Levins MD   Signed by:   Scharlene Gloss CMA (AAMA) on 10/29/2010   Method used:   Electronically to        [facility] [phone]* (retail)       [address]       [place], [place]  [address]       Ph: [phone]       Fax: [phone]   RxID:   [id number]

## 2010-11-06 ENCOUNTER — Encounter (INDEPENDENT_AMBULATORY_CARE_PROVIDER_SITE_OTHER): Payer: Self-pay | Admitting: *Deleted

## 2010-11-12 NOTE — Letter (Signed)
Summary: Referral - not able to see patient  [facility]  [address]   [place], [place] [address]   Phone: [phone]  Fax: [phone]    November 06, 2010   Re:   [name] DOB:  08-15-1961 MRN:   [id number]    Dear Dr. Oliver Barre:  Thank you for your kind referral of the above patient.  We have attempted to schedule the recommended procedure colonoscopy but have not been able to schedule because:  __x_ The patient was not available by phone and/or has not returned our calls.  ___ The patient declined to schedule the procedure at this time.  We appreciate the referral and hope that we will have the opportunity to treat this patient in the future.    Sincerely,    [facility] [phone]

## 2010-11-13 ENCOUNTER — Telehealth: Payer: Self-pay | Admitting: *Deleted

## 2010-11-13 NOTE — Telephone Encounter (Signed)
Pt left vm req status of MRI results. Also wants a call back regarding how and where to pick up her xray's for neuro apt.

## 2010-11-13 NOTE — Telephone Encounter (Signed)
For some reason, the results of the mar 16 MRI were never sent to me (see no MRI on centricity)  Mar 16 LS spine MRI with L3 disc herniation that might touch the nerve  Pt should f/u with Nuerosurgury as planned  I do not know the logistics of how to take the images with her;  Pt should contact place where MRI done, I suspect

## 2010-11-17 NOTE — Telephone Encounter (Signed)
Pt's spouse advised of same and stated that pt only wanted images of MRI to take to NS appt, she was already aware of MRI result. Spouse advised that pt may need to go to facility that perform imaging.

## 2010-11-18 LAB — URINALYSIS, ROUTINE W REFLEX MICROSCOPIC
Bilirubin Urine: NEGATIVE
Hgb urine dipstick: NEGATIVE
Nitrite: NEGATIVE
Specific Gravity, Urine: 1.011 (ref 1.005–1.030)
Urobilinogen, UA: 0.2 mg/dL (ref 0.0–1.0)
pH: 7 (ref 5.0–8.0)

## 2010-11-18 LAB — URINE MICROSCOPIC-ADD ON

## 2010-11-19 ENCOUNTER — Ambulatory Visit (INDEPENDENT_AMBULATORY_CARE_PROVIDER_SITE_OTHER): Payer: BC Managed Care – PPO

## 2010-11-19 DIAGNOSIS — E538 Deficiency of other specified B group vitamins: Secondary | ICD-10-CM

## 2010-11-19 MED ORDER — CYANOCOBALAMIN 1000 MCG/ML IJ SOLN
1000.0000 ug | Freq: Once | INTRAMUSCULAR | Status: AC
  Administered 2010-11-19: 1000 ug via INTRAMUSCULAR

## 2010-12-21 ENCOUNTER — Emergency Department (HOSPITAL_COMMUNITY): Payer: BC Managed Care – PPO

## 2010-12-21 ENCOUNTER — Ambulatory Visit: Payer: BC Managed Care – PPO

## 2010-12-21 ENCOUNTER — Emergency Department (HOSPITAL_COMMUNITY)
Admission: EM | Admit: 2010-12-21 | Discharge: 2010-12-21 | Disposition: A | Discharge status: Home / Self Care | Payer: BC Managed Care – PPO | Attending: Emergency Medicine | Admitting: Emergency Medicine

## 2010-12-21 DIAGNOSIS — R109 Unspecified abdominal pain: Secondary | ICD-10-CM | POA: Insufficient documentation

## 2010-12-21 DIAGNOSIS — K8020 Calculus of gallbladder without cholecystitis without obstruction: Secondary | ICD-10-CM | POA: Insufficient documentation

## 2010-12-21 DIAGNOSIS — I10 Essential (primary) hypertension: Secondary | ICD-10-CM | POA: Insufficient documentation

## 2010-12-21 LAB — URINALYSIS, ROUTINE W REFLEX MICROSCOPIC
Hgb urine dipstick: NEGATIVE
Protein, ur: NEGATIVE mg/dL
Specific Gravity, Urine: 1.01 (ref 1.005–1.030)
Urobilinogen, UA: 0.2 mg/dL (ref 0.0–1.0)

## 2010-12-21 LAB — COMPREHENSIVE METABOLIC PANEL
ALT: 22 U/L (ref 0–35)
AST: 22 U/L (ref 0–37)
Albumin: 4 g/dL (ref 3.5–5.2)
Calcium: 9.1 mg/dL (ref 8.4–10.5)
Creatinine, Ser: 0.97 mg/dL (ref 0.4–1.2)
GFR calc Af Amer: >60 mL/min (ref >60)
GFR calc non Af Amer: >60 mL/min (ref >60)
Sodium: 138 mEq/L (ref 135–145)
Total Protein: 7.1 g/dL (ref 6.0–8.3)

## 2010-12-21 LAB — CBC
HCT: 35.8 % — ABNORMAL LOW (ref 36.0–46.0)
Hemoglobin: 11.7 g/dL — ABNORMAL LOW (ref 12.0–15.0)
MCHC: 32.7 g/dL (ref 30.0–36.0)
RDW: 15.2 % (ref 11.5–15.5)
WBC: 4.4 10*3/uL (ref 4.0–10.5)

## 2010-12-23 ENCOUNTER — Encounter: Payer: Self-pay | Admitting: Internal Medicine

## 2010-12-23 DIAGNOSIS — Z0000 Encounter for general adult medical examination without abnormal findings: Secondary | ICD-10-CM | POA: Insufficient documentation

## 2010-12-24 ENCOUNTER — Ambulatory Visit (INDEPENDENT_AMBULATORY_CARE_PROVIDER_SITE_OTHER): Payer: BC Managed Care – PPO | Admitting: Internal Medicine

## 2010-12-24 ENCOUNTER — Encounter: Payer: Self-pay | Admitting: Internal Medicine

## 2010-12-24 VITALS — BP 110/80 | HR 69 | Temp 98.5°F | Ht 63.0 in | Wt 205.5 lb

## 2010-12-24 DIAGNOSIS — Z0000 Encounter for general adult medical examination without abnormal findings: Secondary | ICD-10-CM

## 2010-12-24 DIAGNOSIS — K219 Gastro-esophageal reflux disease without esophagitis: Secondary | ICD-10-CM

## 2010-12-24 DIAGNOSIS — E538 Deficiency of other specified B group vitamins: Secondary | ICD-10-CM

## 2010-12-24 DIAGNOSIS — I10 Essential (primary) hypertension: Secondary | ICD-10-CM

## 2010-12-24 DIAGNOSIS — K8020 Calculus of gallbladder without cholecystitis without obstruction: Secondary | ICD-10-CM

## 2010-12-24 MED ORDER — CYANOCOBALAMIN 1000 MCG/ML IJ SOLN
1000.0000 ug | Freq: Once | INTRAMUSCULAR | Status: AC
  Administered 2010-12-24: 1000 ug via INTRAMUSCULAR

## 2010-12-24 NOTE — Patient Instructions (Signed)
Continue all other medications as before Please return in Jan 2013 with Lab testing done 3-5 days before

## 2010-12-24 NOTE — Progress Notes (Signed)
Subjective:    Patient ID: [name], female    DOB: 01/05/1961, 50 y.o.   MRN: [id number]  HPI  Here to f/u ER visit with finding of gallstone after abd pain eval;  Now with minimal to no pain , no n/v, f/c, bowel change, blood, no distension, wt loss; tx with pain ,med and nausea med, but no change of PPI and no OTC med use.  Pt denies chest pain, increased sob or doe, wheezing, orthopnea, PND, increased LE swelling, palpitations, dizziness or syncope.  Pt denies new neurological symptoms such as new headache, or facial or extremity weakness or numbness   Pt denies polydipsia, polyuria. Denies worsening reflux, dysphagia, abd pain, n/v, bowel change or blood.  Past Medical History  Diagnosis Date  . VITAMIN B12 DEFICIENCY 03/26/2010  . ANEMIA-IRON DEFICIENCY 01/15/2008  . HYPERTENSION 01/15/2008  . CELLULITIS, LEG, LEFT 04/25/2008  . ARTHRITIS 01/15/2008  . BACK PAIN 02/07/2009  . UTI'S, HX OF 01/15/2008  . HEADACHES, HX OF 01/15/2008  . GERD 09/24/2010  . LUMBAR RADICULOPATHY, LEFT 10/20/2010   Past Surgical History  Procedure Date  . Abdominal hysterectomy 1994    fibroids  . Bilateral oophorectomy   . S/p arthroscopic right knee sugury 10/09    Dr. Renae Fickle    reports that she has never smoked. She does not have any smokeless tobacco history on file. She reports that she does not drink alcohol or use illicit drugs. family history includes Arthritis in her father and mother; Cancer in her other; Diabetes in her father and mother; Heart disease in her mother; Hypertension in her father and mother; and Stroke in her father. Allergies  Allergen Reactions  . Aspirin     REACTION: stomach cramps   Current Outpatient Prescriptions on File Prior to Visit  Medication Sig Dispense Refill  . cyanocobalamin (,VITAMIN B-12,) 1000 MCG/ML injection Inject 1,000 mcg into the muscle every 30 (thirty) days.        . hydrochlorothiazide 25 MG tablet Take 25 mg by mouth daily.        Marland Kitchen  HYDROcodone-acetaminophen (NORCO) 7.5-325 MG per tablet Take 1 tablet by mouth 4 (four) times daily as needed.        Marland Kitchen lisinopril (PRINIVIL,ZESTRIL) 20 MG tablet Take 20 mg by mouth daily.        Marland Kitchen omeprazole (PRILOSEC) 20 MG capsule Take 20 mg by mouth daily.        . potassium chloride (KLOR-CON 10) 10 MEQ CR tablet Take 10 mEq by mouth daily.         Review of Systems All otherwise neg per pt     Objective:   Physical Exam BP 110/80  Pulse 69  Temp(Src) 98.5 F (36.9 C) (Oral)  Ht 5\' 3"  (1.6 m)  Wt 205 lb 8 oz (93.214 kg)  BMI 36.40 kg/m2  SpO2 97% Physical Exam  VS noted Constitutional: Pt appears well-developed and well-nourished.  HENT: Head: Normocephalic.  Right Ear: External ear normal.  Left Ear: External ear normal.  Eyes: Conjunctivae and EOM are normal. Pupils are equal, round, and reactive to light.  Neck: Normal range of motion. Neck supple.  Cardiovascular: Normal rate and regular rhythm.   Pulmonary/Chest: Effort normal and breath sounds normal.  Abd:  Soft, NT, non-distended, + BS Neurological: Pt is alert. No cranial nerve deficit.  Skin: Skin is warm. No erythema.  Psychiatric: Pt behavior is normal. Thought content normal.  Assessment & Plan:

## 2010-12-27 ENCOUNTER — Encounter: Payer: Self-pay | Admitting: Internal Medicine

## 2010-12-27 DIAGNOSIS — K8020 Calculus of gallbladder without cholecystitis without obstruction: Secondary | ICD-10-CM | POA: Insufficient documentation

## 2010-12-27 NOTE — Assessment & Plan Note (Signed)
Also for b12 shot today -

## 2010-12-27 NOTE — Assessment & Plan Note (Signed)
Currently asympt - to cont to monitor for now for recurrent symptoms,  to f/u any worsening symptoms or concerns

## 2010-12-27 NOTE — Assessment & Plan Note (Signed)
stable overall by hx and exam, most recent lab reviewed with pt, and pt to continue medical treatment as before  BP Readings from Last 3 Encounters:  12/24/10 110/80  10/20/10 106/74  09/24/10 102/70

## 2010-12-27 NOTE — Assessment & Plan Note (Signed)
stable overall by hx and exam, most recent lab reviewed with pt, and pt to continue medical treatment as before Lab Results  Component Value Date   WBC 4.4 12/21/2010   HGB 11.7* 12/21/2010   HCT 35.8* 12/21/2010   PLT 268 12/21/2010   CHOL 163 09/09/2010   TRIG 50.0 09/09/2010   HDL 57.10 09/09/2010   ALT 22 12/21/2010   AST 22 12/21/2010   NA 138 12/21/2010   K 3.5 12/21/2010   CL 102 12/21/2010   CREATININE 0.97 12/21/2010   BUN 10 12/21/2010   CO2 28 12/21/2010   TSH 1.68 09/09/2010

## 2010-12-29 NOTE — Op Note (Signed)
[name], [name]              ACCOUNT NO.:  [id number]   MEDICAL RECORD NO.:  [id number]          PATIENT TYPE:  AMB   LOCATION:  [facility]                         FACILITY:  [facility]   PHYSICIAN:  Deidre Ala, M.D.    DATE OF BIRTH:  [birth year]   DATE OF PROCEDURE:  06/11/2008  DATE OF DISCHARGE:                               OPERATIVE REPORT   PREOPERATIVE DIAGNOSES:  1. Right knee lateral meniscus tear.  2. Para meniscal cyst lateral knee.   POSTOPERATIVE DIAGNOSES:  1. Bucket-handle tear lateral meniscus tear with additional radial      tear.  2. Osteoarthritis patellofemoral joint.  3. Tight lateral retinaculum.  4. Para meniscal cyst lateral side.  5. Medial and lateral plica.   PROCEDURE:  1. Right knee operative arthroscopy with partial lateral meniscectomy.  2. Abrasion ablation chondroplasty patellofemoral joint.  3. Lateral retinacular release.  4. Excision para meniscal cyst through separate incision laterally.  5. Medial and lateral plica excision.   SURGEON:  1. Charlesetta Shanks, M.D.   ASSISTANT:  Phineas Semen, P.A.   ANESTHESIA:  General with LMA.   CULTURES:  None.   DRAINS:  None.   BLOOD LOSS:  Minimal.   TOURNIQUET TIME:  1 hour 5 minutes.   PATHOLOGIC FINDINGS AND HISTORY:  This is a 51 year old who complains of  knee pain and was self-referred.  Exam was consistent with a lateral  meniscus tear and we did get a MRI scan which showed a horizontal tear  of anterior horn and midbody lateral meniscus with prominent para  meniscal cyst, small intrameniscal component to the ganglion cyst.  At  surgery she had a quarter-sized area of defect, grade II to III of the  trochlea.  There was a rubbing along the medial femoral condyle and  posterior patella inferomedial.  There were large medial and lateral  plicas.  The ACL was intact.  The lateral meniscus had a bucket-handle  tear and then a radial component to it in mid substance, the classic  parrot beak  that occurs with a lateral meniscus cyst.  All of this was  removed and saucerization and sealed to a stable rim.  The lateral  meniscus cyst had decompressed but was underneath the iliotibial band  laterally.  We dissected down to the capsule and removed a cyst the size  of a collapsed grape and it had gelatinous red brown material in it.  The medial meniscus was intact.  There were plicas.  There was a tight  lateral retinaculum.  All of this was debrided and released as per  above.   PROCEDURE:  With adequate anesthesia obtained using LMA technique, 1  gram Ancef given IV prophylaxis, the patient was placed in the supine  position.  The right lower extremity was prepped from the malleoli to  the leg holder in the standard fashion after standard prepping and  draping. Esmarch exsanguination was used. The tourniquet was let up to  350 mmHg.  I then made an incision over the lateral joint line where I  palpated the cyst preoperatively.  Incision was deepened  sharply with a  knife and hemostasis obtained using the Bovie electrocoagulator.  This  was a short oblique incision.  Dissection was carried down through the  subcutaneous fat to the iliotibial band and retinaculum which was  incised.  I then released it anteriorly posteriorly as it was tight and  then found the blebs of the meniscus.  I then excised the meniscus to  the lateral meniscal rim, completely and thoroughly cauterized it to  prevent recurrence.  Irrigation was carried out.  The iliotibial band  was closed with a figure-of-eight #1 Vicryl.  The subcu with 0 and 2-0  Vicryl and the skin with staples.   Attention was then turned to the arthroscopy where the superior lateral  inflow portal was made. The knee was insufflated with normal saline with  arthroscopic pump.  Medial lateral scope portals were then made and the  joint was thoroughly inspected.  I then shaved the medial plica back to  the sidewall and lysed the medial  band.  I then looked at the lateral  meniscus, probed it, found the bucket handle tear and removed it,  detaching the anterior limb with the meniscus scissors, shaving it out  and then shaving the mid substance tear to the rim with a shaver and  basket and smoothing with an ablator on one.  This sealed it and  cauterized it to prevent recurrence of the cyst.  The meniscus was then  not unstable.  I then checked the medial meniscus.  It was intact.  I  lightly ablated on the medial femoral condyle where the plica had  rubbed.  I then shaved the posterior patella slightly and used the  ablator to smooth.  I then shaved out the lateral gutter synovitis and  observed tilt and track.  I did an arthroscopic lateral retinacular  release from vastus lateralis to the joint line with bleeding points  cauterized.  The knee was then irrigated through the scope, 0.5%  Marcaine with morphine was injected in and about the portals.  The  portals left open.  Bulky sterile compressive dressing was applied with  lateral foam pad for tamponade and easy wrap placed.  The patient having  tolerated the procedure well was awakened, taken to recovery room in  satisfactory condition to be discharged per outpatient routine, given  Percocet for pain and told to call the office for recheck tomorrow.      Deidre Ala, M.D.  Electronically Signed     VEP/MEDQ  D:  06/11/2008  T:  06/11/2008  Job:  [id number]   cc:   Corwin Levins, MD  [address]  [place]  [place] [address]

## 2011-01-25 ENCOUNTER — Ambulatory Visit (INDEPENDENT_AMBULATORY_CARE_PROVIDER_SITE_OTHER): Payer: BC Managed Care – PPO

## 2011-01-25 DIAGNOSIS — E538 Deficiency of other specified B group vitamins: Secondary | ICD-10-CM

## 2011-01-25 MED ORDER — CYANOCOBALAMIN 1000 MCG/ML IJ SOLN
1000.0000 ug | Freq: Once | INTRAMUSCULAR | Status: AC
  Administered 2011-01-25: 1000 ug via INTRAMUSCULAR

## 2011-03-01 ENCOUNTER — Ambulatory Visit: Payer: BC Managed Care – PPO

## 2011-03-01 DIAGNOSIS — Z0289 Encounter for other administrative examinations: Secondary | ICD-10-CM

## 2011-03-15 ENCOUNTER — Emergency Department (HOSPITAL_COMMUNITY)
Admission: EM | Admit: 2011-03-15 | Discharge: 2011-03-16 | Disposition: A | Discharge status: Home / Self Care | Payer: Medicaid Other | Attending: Emergency Medicine | Admitting: Emergency Medicine

## 2011-03-15 DIAGNOSIS — R112 Nausea with vomiting, unspecified: Secondary | ICD-10-CM | POA: Insufficient documentation

## 2011-03-15 DIAGNOSIS — R1011 Right upper quadrant pain: Secondary | ICD-10-CM | POA: Insufficient documentation

## 2011-03-15 DIAGNOSIS — K8020 Calculus of gallbladder without cholecystitis without obstruction: Secondary | ICD-10-CM | POA: Insufficient documentation

## 2011-03-15 DIAGNOSIS — I10 Essential (primary) hypertension: Secondary | ICD-10-CM | POA: Insufficient documentation

## 2011-03-15 LAB — DIFFERENTIAL
Basophils Absolute: 0 10*3/uL (ref 0.0–0.1)
Lymphocytes Relative: 56 % — ABNORMAL HIGH (ref 12–46)
Monocytes Absolute: 0.4 10*3/uL (ref 0.1–1.0)
Neutro Abs: 1.8 10*3/uL (ref 1.7–7.7)
Neutrophils Relative %: 34 % — ABNORMAL LOW (ref 43–77)

## 2011-03-15 LAB — COMPREHENSIVE METABOLIC PANEL
ALT: 26 U/L (ref 0–35)
Alkaline Phosphatase: 81 U/L (ref 39–117)
BUN: 16 mg/dL (ref 6–23)
CO2: 27 mEq/L (ref 19–32)
Calcium: 9.1 mg/dL (ref 8.4–10.5)
GFR calc Af Amer: >60 mL/min (ref >60)
GFR calc non Af Amer: 60 mL/min — ABNORMAL LOW (ref >60)
Glucose, Bld: 95 mg/dL (ref 70–99)
Sodium: 141 mEq/L (ref 135–145)

## 2011-03-15 LAB — URINALYSIS, ROUTINE W REFLEX MICROSCOPIC
Glucose, UA: NEGATIVE mg/dL
Protein, ur: NEGATIVE mg/dL
Specific Gravity, Urine: 1.014 (ref 1.005–1.030)
pH: 5.5 (ref 5.0–8.0)

## 2011-03-15 LAB — CBC
HCT: 35.8 % — ABNORMAL LOW (ref 36.0–46.0)
Hemoglobin: 11.5 g/dL — ABNORMAL LOW (ref 12.0–15.0)
MCHC: 32.1 g/dL (ref 30.0–36.0)
RBC: 4.9 MIL/uL (ref 3.87–5.11)

## 2011-03-18 ENCOUNTER — Emergency Department (HOSPITAL_COMMUNITY)
Admission: EM | Admit: 2011-03-18 | Discharge: 2011-03-19 | Disposition: A | Discharge status: Home / Self Care | Payer: Self-pay | Attending: Emergency Medicine | Admitting: Emergency Medicine

## 2011-03-18 DIAGNOSIS — K8020 Calculus of gallbladder without cholecystitis without obstruction: Secondary | ICD-10-CM | POA: Insufficient documentation

## 2011-03-18 DIAGNOSIS — I10 Essential (primary) hypertension: Secondary | ICD-10-CM | POA: Insufficient documentation

## 2011-03-18 DIAGNOSIS — R110 Nausea: Secondary | ICD-10-CM | POA: Insufficient documentation

## 2011-03-18 DIAGNOSIS — R1011 Right upper quadrant pain: Secondary | ICD-10-CM | POA: Insufficient documentation

## 2011-03-18 DIAGNOSIS — R1013 Epigastric pain: Secondary | ICD-10-CM | POA: Insufficient documentation

## 2011-03-18 LAB — DIFFERENTIAL
Basophils Absolute: 0 10*3/uL (ref 0.0–0.1)
Eosinophils Absolute: 0.1 10*3/uL (ref 0.0–0.7)
Lymphocytes Relative: 38 % (ref 12–46)
Neutro Abs: 2.5 10*3/uL (ref 1.7–7.7)
Neutrophils Relative %: 52 % (ref 43–77)

## 2011-03-18 LAB — COMPREHENSIVE METABOLIC PANEL
BUN: 14 mg/dL (ref 6–23)
CO2: 30 mEq/L (ref 19–32)
Calcium: 9.5 mg/dL (ref 8.4–10.5)
Creatinine, Ser: 0.96 mg/dL (ref 0.50–1.10)
GFR calc Af Amer: >60 mL/min (ref >60)
GFR calc non Af Amer: >60 mL/min (ref >60)
Glucose, Bld: 106 mg/dL — ABNORMAL HIGH (ref 70–99)

## 2011-03-18 LAB — CBC
MCH: 24 pg — ABNORMAL LOW (ref 26.0–34.0)
Platelets: 297 10*3/uL (ref 150–400)
RBC: 4.95 MIL/uL (ref 3.87–5.11)
WBC: 4.8 10*3/uL (ref 4.0–10.5)

## 2011-03-18 LAB — URINALYSIS, ROUTINE W REFLEX MICROSCOPIC
Glucose, UA: NEGATIVE mg/dL
Hgb urine dipstick: NEGATIVE
Ketones, ur: NEGATIVE mg/dL
Protein, ur: NEGATIVE mg/dL

## 2011-03-24 ENCOUNTER — Inpatient Hospital Stay (HOSPITAL_COMMUNITY)
Admission: EM | Admit: 2011-03-24 | Discharge: 2011-03-26 | DRG: 419 | Disposition: A | Discharge status: Home / Self Care | Payer: Medicaid Other | Attending: General Surgery | Admitting: General Surgery

## 2011-03-24 ENCOUNTER — Emergency Department (HOSPITAL_COMMUNITY): Payer: Medicaid Other

## 2011-03-24 DIAGNOSIS — Z6836 Body mass index (BMI) 36.0-36.9, adult: Secondary | ICD-10-CM

## 2011-03-24 DIAGNOSIS — R112 Nausea with vomiting, unspecified: Secondary | ICD-10-CM

## 2011-03-24 DIAGNOSIS — R1011 Right upper quadrant pain: Secondary | ICD-10-CM

## 2011-03-24 DIAGNOSIS — E663 Overweight: Secondary | ICD-10-CM | POA: Diagnosis present

## 2011-03-24 DIAGNOSIS — Z90710 Acquired absence of both cervix and uterus: Secondary | ICD-10-CM

## 2011-03-24 DIAGNOSIS — K660 Peritoneal adhesions (postprocedural) (postinfection): Secondary | ICD-10-CM | POA: Diagnosis present

## 2011-03-24 DIAGNOSIS — I10 Essential (primary) hypertension: Secondary | ICD-10-CM | POA: Diagnosis present

## 2011-03-24 DIAGNOSIS — M549 Dorsalgia, unspecified: Secondary | ICD-10-CM | POA: Diagnosis present

## 2011-03-24 DIAGNOSIS — K8000 Calculus of gallbladder with acute cholecystitis without obstruction: Principal | ICD-10-CM | POA: Diagnosis present

## 2011-03-24 DIAGNOSIS — K219 Gastro-esophageal reflux disease without esophagitis: Secondary | ICD-10-CM | POA: Diagnosis present

## 2011-03-24 DIAGNOSIS — G8929 Other chronic pain: Secondary | ICD-10-CM | POA: Diagnosis present

## 2011-03-24 LAB — COMPREHENSIVE METABOLIC PANEL
BUN: 12 mg/dL (ref 6–23)
CO2: 24 mEq/L (ref 19–32)
Calcium: 10.5 mg/dL (ref 8.4–10.5)
GFR calc Af Amer: >60 mL/min (ref >60)
GFR calc non Af Amer: >60 mL/min (ref >60)
Glucose, Bld: 99 mg/dL (ref 70–99)
Total Protein: 9 g/dL — ABNORMAL HIGH (ref 6.0–8.3)

## 2011-03-24 LAB — URINALYSIS, ROUTINE W REFLEX MICROSCOPIC
Hgb urine dipstick: NEGATIVE
Nitrite: NEGATIVE
Specific Gravity, Urine: 1.009 (ref 1.005–1.030)
Urobilinogen, UA: 1 mg/dL (ref 0.0–1.0)

## 2011-03-24 LAB — LIPASE, BLOOD: Lipase: 31 U/L (ref 11–59)

## 2011-03-24 LAB — DIFFERENTIAL
Basophils Absolute: 0 10*3/uL (ref 0.0–0.1)
Eosinophils Relative: 1 % (ref 0–5)
Monocytes Absolute: 0.4 10*3/uL (ref 0.1–1.0)
Neutrophils Relative %: 61 % (ref 43–77)

## 2011-03-24 LAB — URINE MICROSCOPIC-ADD ON

## 2011-03-24 LAB — CBC
HCT: 41.2 % (ref 36.0–46.0)
Platelets: 361 10*3/uL (ref 150–400)
RDW: 14.8 % (ref 11.5–15.5)
WBC: 6.4 10*3/uL (ref 4.0–10.5)

## 2011-03-25 ENCOUNTER — Inpatient Hospital Stay (HOSPITAL_COMMUNITY): Payer: Medicaid Other

## 2011-03-25 ENCOUNTER — Encounter (INDEPENDENT_AMBULATORY_CARE_PROVIDER_SITE_OTHER): Payer: Self-pay | Admitting: Surgery

## 2011-03-25 ENCOUNTER — Other Ambulatory Visit (INDEPENDENT_AMBULATORY_CARE_PROVIDER_SITE_OTHER): Payer: Self-pay | Admitting: General Surgery

## 2011-03-25 DIAGNOSIS — K8000 Calculus of gallbladder with acute cholecystitis without obstruction: Secondary | ICD-10-CM

## 2011-03-26 LAB — URINE CULTURE

## 2011-03-27 NOTE — Op Note (Signed)
NAMERASHIDAH, [name]              ACCOUNT NO.:  [id number]  MEDICAL RECORD NO.:  [id number]  LOCATION:  1534                         FACILITY:  [facility]  PHYSICIAN:  Lodema Pilot, MD       DATE OF BIRTH:  03/08/1961  DATE OF PROCEDURE:  03/25/2011 DATE OF DISCHARGE:                              OPERATIVE REPORT   PROCEDURE:  Laparoscopic cholecystectomy with intraoperative cholangiogram and lysis of adhesions.  PREOPERATIVE DIAGNOSIS:  Symptomatic cholelithiasis.  POSTOPERATIVE DIAGNOSES:  Acute cholecystitis and intra-abdominal adhesions.  SURGEON:  Lodema Pilot, MD  ASSISTANT:  None.  ANESTHESIA:  General endotracheal anesthesia with 30 cc of 1% lidocaine with epinephrine, 0.25%  Marcaine in a 50/50 mixture.  FLUIDS:  1300 cc of crystalloid.  ESTIMATED BLOOD LOSS:  50 cc.  DRAINS:  None.  SPECIMENS:  Gallbladder and contents sent to Pathology for permanent sectioning.  COMPLICATIONS:  None apparent.  FINDINGS:  Significant amount of intra-abdominal adhesions from the umbilicus below, extending to the pelvis.  No evidence of bowel injury upon entry.  Lysis of adhesions was necessary for trocar placement.  She had a thickened and very edematous gallbladder wall with the surrounding fat and retroperitoneum was edematous and consistent with acute cholecystitis.  Normal cholangiogram showing free flow of bowel into the duodenum and normal filling of the right and left hepatic ducts and no common bile duct filling defects.  INDICATION FOR PROCEDURE:  [name] is a 50 year old female with unrelenting right upper quadrant pain, consistent with symptomatic cholelithiasis.  Ultrasound showing cholelithiasis, but no evidence of acute cholecystitis.  She was tender on exam and concern was for symptomatic cholelithiasis.  OPERATIVE DETAILS:  [name] was seen and evaluated in the preoperative area and on the surgical ward.  Risks and benefits of the procedure were  discussed in lay terms and informed consent was obtained. Discussed the risks of infection, bleeding, pain, scarring, persistent symptoms, injury to the bowel or bile ducts, diarrhea, and need for open surgery were discussed.  She was given prophylactic antibiotics and taken to the operating room, placed on table in a supine position. General endotracheal anesthesia was obtained and her abdomen was prepped and draped in a standard surgical fashion.  A semicircular infraumbilical incision was made in the skin and dissection carried down through the subcutaneous tissue using blunt dissection.  The abdominal wall fascia was elevated between Kocher clamps and sharply incised and the peritoneum was elevated again sharply incised.  Medially underlying the peritoneum was some omentum and fatty adhesions, so I have use down my finger enough to allow for placement of a balloon trocar.  A 10 mm balloon trocar was placed and pneumoperitoneum was obtained.  A laparoscope was placed into the abdomen and there was no evidence of bowel injury upon entry, although visualization was difficult to medially as there were still a large amount of omental adhesions and there was some surrounding loops of small bowel as well as what appeared to be a sigmoid colon in the area of the entry.  I placed a 5 mm epigastric port and two 5 mm right upper quadrant trocars under direct visualization.  The upper abdomen was free of  adhesions.  I switched the camera to the epigastric trocar and working through the right upper quadrant trocars, I took down some of the adhesions from the abdominal wall of the omentum using blunt dissection.  There were some thin, see through adhesions to the abdominal wall which were divided with sharp dissection.  I inspected the underlying bowel contents again, although there was loop of small bowel inferior to our incision.  This appeared to be unaffected and not in proximity enough to be  injured.  During entry, the colon underneath appeared to be okay without any evidence of injury and placed camera at the umbilicus and proceeded with cholecystectomy in the standard fashion.  The gallbladder was retracted in cephalad, although was difficult to grab because it was very tight and thick and edematous, but we did not need to drain it.  Gallbladder was retracted cephalad and the peritoneum was taken down using blunt dissection.  The duodenum was closely adherent as well, but these were through thin filmy attachments and these were easily divided with blunt dissection.  She had a very large node of Calot and the node was dissected and taken down from the gallbladder which revealed what appeared to be the cystic artery just posterior to the lymph node.  The artery and cystic duct were skeletonized using a blunt dissection.  Some of the peritoneum high on the gallbladder was divided to allow [place] to further skeletonize the area of the triangle of Calot.  The cystic duct was visualized and critical view of safety was obtained by dissecting this out and visualizing the liver parenchyma through the triangle of Calot with a single cystic artery coursing up on to the gallbladder, a single cystic duct entering the gallbladder.  A clip was placed on the cystic duct high on the gallbladder side and a cholangiogram catheter was placed to the abdominal wall through a 14-gauge angiocatheter.  A small cystic ductotomy was made and cholangiogram catheter was placed into the duct and cholangiogram was performed which demonstrated no common bile duct filling defects, normal flow in the right and left hepatic ducts, and free flow bowel into the duodenum.  Catheter was removed and the cystic duct was clipped with Hemoclips and the artery was also transected between Hemoclips and divided and the gallbladder was removed from the gallbladder fossa using Bovie electrocautery.  The gallbladder was very  edematous, had a good plane between the gallbladder and the liver, but again this was very edematous, consistent with acute cholecystitis.  The gallbladder was not entered during the dissection and it was removed from the umbilical incision in an EndoCatch bag. Palpation of the gallbladder revealed a few gallstones which were palpable within the gallbladder.  The specimen sent to Pathology for permanent sectioning.  The camera was re-introduced into the abdomen and the gallbladder fossa was inspected for hemostasis which was noted to be adequate.  The right upper quadrant was irrigated with saline solution until the irrigation returned clear.  There was no evidence of bleeding. The clips appeared to be in good position and the umbilical trocar was removed and closed in an open fashion using 0 Vicryl sutures.  After the umbilical fascia was closed, the abdomen was again insufflated with carbon dioxide gas and the right upper quadrant was noted to be hemostatic without evidence of bowel injury.  The area underlying the umbilical closure was inspected, there was no evidence of bowel injury and closure appeared to be adequate.  The right upper quadrant  trocars were removed under direct visualization and the epigastric trocar was also removed.  The wounds were injected with 30 cc of 1% lidocaine with epinephrine and 0.25% Marcaine in a 50/50 mixture and the skin edges were approximated with 4-0 Monocryl subcuticular suture.  Skin was washed and dried and Dermabond was applied.  All sponge, needle, and instrument counts were correct at the end of the case and the patient tolerated the procedure well without apparent complications.          ______________________________ Lodema Pilot, MD     BL/MEDQ  D:  03/25/2011  T:  03/26/2011  Job:  [id number]  Electronically Signed by Lodema Pilot DO on 03/27/2011 06:03:05 PM

## 2011-03-29 ENCOUNTER — Ambulatory Visit (INDEPENDENT_AMBULATORY_CARE_PROVIDER_SITE_OTHER): Payer: Self-pay | Admitting: Surgery

## 2011-04-07 ENCOUNTER — Encounter (INDEPENDENT_AMBULATORY_CARE_PROVIDER_SITE_OTHER): Payer: Self-pay | Admitting: General Surgery

## 2011-04-09 ENCOUNTER — Encounter (INDEPENDENT_AMBULATORY_CARE_PROVIDER_SITE_OTHER): Payer: Self-pay | Admitting: General Surgery

## 2011-04-09 ENCOUNTER — Ambulatory Visit (INDEPENDENT_AMBULATORY_CARE_PROVIDER_SITE_OTHER): Payer: Self-pay | Admitting: General Surgery

## 2011-04-09 VITALS — BP 120/81 | Temp 97.0°F

## 2011-04-09 DIAGNOSIS — Z4889 Encounter for other specified surgical aftercare: Secondary | ICD-10-CM

## 2011-04-09 DIAGNOSIS — Z5189 Encounter for other specified aftercare: Secondary | ICD-10-CM

## 2011-04-09 NOTE — Progress Notes (Signed)
Subjective:     Patient ID: [name], female   DOB: 02-25-1961, 50 y.o.   MRN: [id number]  HPI She follows up approximately 2 weeks status laparoscopic cholecystectomy for acute cholecystitis. She is doing very well and denies any pain. She has not taken any pain medication since her procedure. She is tolerating regular diet although she does have some loose stools after eating but she states that this is improving with time. Her pathology was benign consistent with acute cholecystitis.  Review of Systems     Objective:   Physical Exam She is in no acute distress and nontoxic-appearing  Her abdomen is soft and nontender on exam and nondistended. Her incisions are healing well without sign of infection    Assessment:     Status post laparoscopic cholecystectomy doing well    Plan:     I recommended low-fat diet for the initial postoperative period which might help with her loose stools after eating. Otherwise she is doing very well and has no complaints her pathology was benign. She can increase activity as tolerated and follow up with me on a p.r.n. basis

## 2011-04-27 NOTE — H&P (Signed)
[name], [name]              ACCOUNT NO.:  [id number]  MEDICAL RECORD NO.:  [id number]  LOCATION:  WLED                         FACILITY:  [facility]  PHYSICIAN:  Lodema Pilot, MD       DATE OF BIRTH:  1960-12-13  DATE OF ADMISSION:  03/24/2011 DATE OF DISCHARGE:                             HISTORY & PHYSICAL   REFERRING PHYSICIAN:  Dr. Freida Busman, ER.  CHIEF COMPLAINT:  Back pain, cholelithiasis, nausea, and vomiting.  PRIMARY CARE DOCTOR:  Dr. Oliver Barre.  BRIEF HISTORY:  The patient is a 50 year old African American female with a 64-month history of abdominal pain, which has become worse in the last 2 weeks.  She is having nausea, vomiting, and right abdominal pain. It got worse last Tuesday after eating, sometimes pain lasts all day and all night.  She has been taking Vicodin without any significant relief. She has been seen in the ER twice in the last week.  She is scheduled to see someone in our office this coming Friday for elective cholecystectomy.  This is not a GERD pain nor is it related to her chronic back pain.  PAST MEDICAL HISTORY: 1. Disk disease, L3-L4, spondylosis L4-L5, and history of C5-C6     disease with chronic back pain. 2. GERD. 3. Hypertension. 4. BMI of 36, weight 204 pounds, height is 63 inches.  PAST SURGICAL HISTORY:  She had right knee done in October 2009, status post hysterectomy, status post 2 C-sections.  The last one was very difficult, apparently she had a good deal of adhesions.  FAMILY HISTORY:  Mother is living and has diabetes, coronary artery disease, and hypertension.  Father died of complications of diabetes. She has half brothers with diabetes.  She has 8 sisters and she knows that there is at least hypertension within those siblings.  SOCIAL HISTORY:  Tobacco none.  Alcohol none.  Drugs none.  She is married.  She is a [employer] doctor.  REVIEW OF SYSTEMS:  FEVER:  None.  SKIN:  No changes.  PSYCHIATRIC:  No changes.  WEIGHT:  Down  slightly.  She took her weight yesterday.  She is gone from 204 down to 195 over the last 2 weeks.  CV:  Negative for headaches, dizziness, syncope, stroke, or seizure.  CARDIAC:  No chest pain.  No palpitations.  PULMONARY:  No orthopnea.  No PND.  No sleep apnea.  She does have dyspnea on exertion.  No coughing or wheezing. GI:  Positive for GERD.  Positive for nausea and vomiting since Tuesday. No diarrhea or constipation.  No blood in her stool.  GU:  No trouble voiding.  LOWER EXTREMITIES:  No edema.  She does have calf pain with ambulation.  MUSCULOSKELETAL:  She has chronic back pain.  ENDOCRINE: No known diabetes or thyroid disease.  ALLERGIES:  ASPIRIN causes an upset to stomach.  CURRENT MEDICATIONS: 1. Prilosec 20 mg daily. 2. Lisinopril 20 mg daily. 3. Hydrochlorothiazide 25 mg daily. 4. KCl 10 mEq daily. 5. Vicodin 2 q.4 h. p.r.n.  PHYSICAL EXAMINATION:  GENERAL:  This is a well-nourished overweight African American female who is actually quite miserable, but in no acute distress. VITAL SIGNS:  Temperature is 98, heart rate is 95, blood pressure is 165/103, respiratory rate is 19, and saturations are 100% on room air. HEAD:  Normocephalic. EYES, EARS, NOSE, AND THROAT:  Within normal limits.  Trachea is in the midline. NECK:  There are no bruits.  No JVD.  No thyromegaly. CHEST:  Clear to auscultation and percussion.  Nontender. CARDIAC:  Normal S1.  No murmurs or rubs were heard.  Pulses are +2 in both upper and lower extremities. ABDOMEN:  Soft, positive bowel sounds.  She is tender in the right upper quadrant.  She is not distended.  There are no masses, hernias, or abscesses noted.  She has a surgical scar below the umbilicus. MUSCULOSKELETAL:  No joint changes. SKIN:  Normal. NEUROLOGIC:  No focal changes.  Cranial nerves II through XII grossly intact. PSYCHIATRIC:  Normal affect.  LABORATORY DATA:  White count is 6.4, hemoglobin is 13.5, hematocrit  is 41.2, and platelets are 361,000.  UA shows 11 to 20 wbc's per high-power field.  Sodium is 134, potassium is 3.9, chloride is 96, CO2 is 24, BUN is 12, creatinine is 0.94, lipase is 31, glucose 99, SGOT is 26, SGPT is 31, total bilirubin 0.8, and alkaline phosphatase 68.  Ultrasound on Dec 21, 2010, shows multiple gallstones, 1 in the neck of gallbladder.  There was no edema or thickening at that time.  Murphy sign was negative. There is also hepatic steatosis and a possible splenic hemangioma.  IMPRESSION: 1. Cholelithiasis/cholecystitis. 2. Hypertension. 3. Gastroesophageal reflux disease. 4. Chronic disk disease with chronic back pain. 5. BMI of 36. 6. Possible urinary tract infection.  PLAN:  We are going to admit the patient, hydrate her, keep her n.p.o. after midnight, give her clear liquids tonight as tolerated.  She had been started on cefoxitin in the ER.  I will continue that.  We will treat her for nausea and try to control her abdominal discomfort and tentatively schedule for cholecystectomy in the a.m.     Eber Hong, P.A.   ______________________________ Lodema Pilot, MD    WDJ/MEDQ  D:  03/24/2011  T:  03/24/2011  Job:  [id number]  cc:   Eber Hong, P.A.  Corwin Levins, MD [address] [place] [address]  Electronically Signed by Sherrie George P.A. on 04/08/2011 07:27:39 PM Electronically Signed by Lodema Pilot DO on 04/27/2011 03:55:59 PM

## 2011-05-18 LAB — POCT I-STAT 4, (NA,K, GLUC, HGB,HCT)
Glucose, Bld: 101 — ABNORMAL HIGH
HCT: 43
Potassium: 3.6

## 2011-05-20 ENCOUNTER — Other Ambulatory Visit: Payer: Self-pay | Admitting: Internal Medicine

## 2011-08-26 ENCOUNTER — Ambulatory Visit: Payer: BC Managed Care – PPO | Admitting: Internal Medicine

## 2012-06-14 ENCOUNTER — Other Ambulatory Visit (HOSPITAL_COMMUNITY): Payer: Self-pay | Admitting: Family Medicine

## 2012-06-14 DIAGNOSIS — Z1231 Encounter for screening mammogram for malignant neoplasm of breast: Secondary | ICD-10-CM

## 2012-06-15 ENCOUNTER — Ambulatory Visit (HOSPITAL_COMMUNITY)
Admission: RE | Admit: 2012-06-15 | Discharge: 2012-06-15 | Disposition: A | Discharge status: Home / Self Care | Payer: Private Health Insurance - Indemnity | Source: Ambulatory Visit | Attending: Family Medicine | Admitting: Family Medicine

## 2012-06-15 DIAGNOSIS — Z1231 Encounter for screening mammogram for malignant neoplasm of breast: Secondary | ICD-10-CM | POA: Insufficient documentation

## 2013-11-20 ENCOUNTER — Ambulatory Visit (HOSPITAL_BASED_OUTPATIENT_CLINIC_OR_DEPARTMENT_OTHER): Payer: Self-pay

## 2014-09-25 ENCOUNTER — Ambulatory Visit
Admission: RE | Admit: 2014-09-25 | Discharge: 2014-09-25 | Disposition: A | Discharge status: Home / Self Care | Payer: 59 | Source: Ambulatory Visit | Attending: Family Medicine | Admitting: Family Medicine

## 2014-09-25 ENCOUNTER — Other Ambulatory Visit: Payer: Self-pay | Admitting: Family Medicine

## 2014-09-25 DIAGNOSIS — M545 Low back pain: Secondary | ICD-10-CM

## 2014-09-25 DIAGNOSIS — M25562 Pain in left knee: Secondary | ICD-10-CM

## 2014-10-17 ENCOUNTER — Other Ambulatory Visit: Payer: Self-pay | Admitting: Family Medicine

## 2014-10-17 DIAGNOSIS — M5136 Other intervertebral disc degeneration, lumbar region: Secondary | ICD-10-CM

## 2014-10-19 ENCOUNTER — Ambulatory Visit
Admission: RE | Admit: 2014-10-19 | Discharge: 2014-10-19 | Disposition: A | Discharge status: Home / Self Care | Payer: 59 | Source: Ambulatory Visit | Attending: Family Medicine | Admitting: Family Medicine

## 2014-10-19 DIAGNOSIS — M5136 Other intervertebral disc degeneration, lumbar region: Secondary | ICD-10-CM

## 2015-11-24 ENCOUNTER — Other Ambulatory Visit: Payer: Self-pay | Admitting: Family Medicine

## 2015-11-24 DIAGNOSIS — Z0000 Encounter for general adult medical examination without abnormal findings: Secondary | ICD-10-CM | POA: Diagnosis not present

## 2015-11-24 DIAGNOSIS — Z1239 Encounter for other screening for malignant neoplasm of breast: Secondary | ICD-10-CM | POA: Diagnosis not present

## 2015-11-24 DIAGNOSIS — Z13220 Encounter for screening for lipoid disorders: Secondary | ICD-10-CM | POA: Diagnosis not present

## 2015-11-24 DIAGNOSIS — Z1231 Encounter for screening mammogram for malignant neoplasm of breast: Secondary | ICD-10-CM

## 2015-12-12 ENCOUNTER — Ambulatory Visit
Admission: RE | Admit: 2015-12-12 | Discharge: 2015-12-12 | Disposition: A | Discharge status: Home / Self Care | Payer: 59 | Source: Ambulatory Visit | Attending: Family Medicine | Admitting: Family Medicine

## 2015-12-12 DIAGNOSIS — Z1231 Encounter for screening mammogram for malignant neoplasm of breast: Secondary | ICD-10-CM | POA: Diagnosis not present

## 2015-12-16 MED FILL — LISINOPRIL-HCTZ 20-25 MG TA: 20-25 | 90 days supply | Qty: 90 | Fill #0

## 2016-04-05 MED FILL — LISINOPRIL-HCTZ 20-25 MG TA: 20-25 | 90 days supply | Qty: 90 | Fill #0

## 2016-05-25 DIAGNOSIS — I10 Essential (primary) hypertension: Secondary | ICD-10-CM | POA: Diagnosis not present

## 2016-05-25 DIAGNOSIS — M7730 Calcaneal spur, unspecified foot: Secondary | ICD-10-CM | POA: Diagnosis not present

## 2016-05-25 DIAGNOSIS — M25511 Pain in right shoulder: Secondary | ICD-10-CM | POA: Diagnosis not present

## 2016-05-25 DIAGNOSIS — M5442 Lumbago with sciatica, left side: Secondary | ICD-10-CM | POA: Diagnosis not present

## 2016-05-25 DIAGNOSIS — G8929 Other chronic pain: Secondary | ICD-10-CM | POA: Diagnosis not present

## 2016-05-25 DIAGNOSIS — R7301 Impaired fasting glucose: Secondary | ICD-10-CM | POA: Diagnosis not present

## 2016-05-25 DIAGNOSIS — M5441 Lumbago with sciatica, right side: Secondary | ICD-10-CM | POA: Diagnosis not present

## 2016-05-25 DIAGNOSIS — M25562 Pain in left knee: Secondary | ICD-10-CM | POA: Diagnosis not present

## 2016-05-25 DIAGNOSIS — M25561 Pain in right knee: Secondary | ICD-10-CM | POA: Diagnosis not present

## 2016-06-30 ENCOUNTER — Ambulatory Visit: Payer: 59 | Admitting: Podiatry

## 2016-08-11 DIAGNOSIS — M7501 Adhesive capsulitis of right shoulder: Secondary | ICD-10-CM | POA: Diagnosis not present

## 2016-08-12 ENCOUNTER — Ambulatory Visit (INDEPENDENT_AMBULATORY_CARE_PROVIDER_SITE_OTHER): Payer: 59

## 2016-08-12 ENCOUNTER — Encounter: Payer: Self-pay | Admitting: Podiatry

## 2016-08-12 ENCOUNTER — Ambulatory Visit (INDEPENDENT_AMBULATORY_CARE_PROVIDER_SITE_OTHER): Payer: 59 | Admitting: Podiatry

## 2016-08-12 VITALS — BP 160/87 | HR 78 | Resp 16 | Ht 63.0 in | Wt 195.0 lb

## 2016-08-12 DIAGNOSIS — M7662 Achilles tendinitis, left leg: Secondary | ICD-10-CM | POA: Diagnosis not present

## 2016-08-12 DIAGNOSIS — M7661 Achilles tendinitis, right leg: Secondary | ICD-10-CM

## 2016-08-12 DIAGNOSIS — M79672 Pain in left foot: Secondary | ICD-10-CM

## 2016-08-12 DIAGNOSIS — M79671 Pain in right foot: Secondary | ICD-10-CM

## 2016-08-12 MED ORDER — TRIAMCINOLONE ACETONIDE 10 MG/ML IJ SUSP
10.0000 mg | Freq: Once | INTRAMUSCULAR | Status: AC
  Administered 2016-08-12: 10 mg

## 2016-08-12 NOTE — Progress Notes (Signed)
   Subjective:    Patient ID: [name], female    DOB: 1961/06/21, 55 y.o.   MRN: [id number]  HPI Chief Complaint  Patient presents with  . Foot Pain    Bilateral; back of heel; pt stated, "I have spurs; have had this pain for past 3-4 yrs"      Review of Systems  Eyes: Positive for itching.  Musculoskeletal: Positive for back pain and gait problem.  All other systems reviewed and are negative.      Objective:   Physical Exam        Assessment & Plan:

## 2016-08-12 NOTE — Progress Notes (Signed)
Subjective:     Patient ID: [name], female   DOB: May 06, 1961, 55 y.o.   MRN: [id number]  HPI patient presents stating that she has chronic spurs in the back of her heel left over right and that she had had an injection several years ago which gave temporary relief she's had physical therapy orthotics and padding without relief and the pain continues to intensify and make it difficult for her to be active   Review of Systems  All other systems reviewed and are negative.      Objective:   Physical Exam  Constitutional: She is oriented to person, place, and time.  Cardiovascular: Intact distal pulses.   Musculoskeletal: Normal range of motion.  Neurological: She is oriented to person, place, and time.  Skin: Skin is warm.  Nursing note and vitals reviewed.  neurovascular status intact muscle strength was adequate with range of motion noted to be normal. Patient had no equinus condition currently and has significant discomfort at the insertion of the Achilles more on the lateral side posterior left over right with warmth in the area noted and discomfort when palpated. 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 I spent a great of time with her going over this and discussing conservative and surgical care and we are going to try first and aggressive conservative procedure and decide the response and then decide whether or not in the long run surgical intervention is appropriate. I did explain the risk of injection and she wants to have this done and today I did a careful injection of the lateral side 3 mg Dexon some Kenalog 5 mill grams Xylocaine left foot and applied air fracture walker with instructions on stretching exercises and also dispensed silicone Achilles tendon  stockings to try to take pressure off the back of the heels  X-ray report indicated large spur formation posterior heel left over right

## 2016-08-12 NOTE — Patient Instructions (Signed)

## 2016-09-06 MED FILL — LISINOPRIL-HCTZ 20-25 MG TA: 20-25 | 90 days supply | Qty: 90 | Fill #1

## 2016-09-09 ENCOUNTER — Ambulatory Visit: Payer: 59 | Admitting: Podiatry

## 2016-09-10 ENCOUNTER — Ambulatory Visit (INDEPENDENT_AMBULATORY_CARE_PROVIDER_SITE_OTHER): Payer: 59 | Admitting: Podiatry

## 2016-09-10 ENCOUNTER — Encounter: Payer: Self-pay | Admitting: Podiatry

## 2016-09-10 DIAGNOSIS — M7661 Achilles tendinitis, right leg: Secondary | ICD-10-CM | POA: Diagnosis not present

## 2016-09-10 DIAGNOSIS — M7662 Achilles tendinitis, left leg: Secondary | ICD-10-CM | POA: Diagnosis not present

## 2016-09-10 DIAGNOSIS — L600 Ingrowing nail: Secondary | ICD-10-CM | POA: Diagnosis not present

## 2016-09-10 NOTE — Patient Instructions (Addendum)

## 2016-09-11 NOTE — Progress Notes (Signed)
Subjective:     Patient ID: [name], female   DOB: Oct 21, 1960, 56 y.o.   MRN: [id number]  HPI patient presents stating my Achilles is doing pretty well and I bones with both my big toe nails been sore and making it hard for me to wear shoe gear   Review of Systems     Objective:   Physical Exam Neurovascular status intact with patient's heels doing very well with diminished discomfort diminished swelling and erythema around the area. Incurvated hallux nails medial border bilateral with pain    Assessment:     Doing well post treatment of the Achilles with patient having ingrown toenail deformity bilateral    Plan:     H&P conditions reviewed and recommended correction of ingrown toenails. I explained risk of procedures and patient wants surgery and today infiltrated each hallux 60 Milligan times like Marcaine mixture remove the medial borders exposed matrix and applied phenol 3 applications 30 seconds followed by alcohol lavage. I then went ahead discussed continued conservative treatment for Achilles tendon which I reviewed with patient

## 2016-11-14 DIAGNOSIS — R7303 Prediabetes: Secondary | ICD-10-CM

## 2016-11-14 HISTORY — DX: Prediabetes: R73.03

## 2016-11-15 DIAGNOSIS — M7501 Adhesive capsulitis of right shoulder: Secondary | ICD-10-CM | POA: Diagnosis not present

## 2016-11-25 DIAGNOSIS — E669 Obesity, unspecified: Secondary | ICD-10-CM | POA: Diagnosis not present

## 2016-11-25 DIAGNOSIS — Z0000 Encounter for general adult medical examination without abnormal findings: Secondary | ICD-10-CM | POA: Diagnosis not present

## 2016-11-25 DIAGNOSIS — I10 Essential (primary) hypertension: Secondary | ICD-10-CM | POA: Diagnosis not present

## 2016-11-25 DIAGNOSIS — R7303 Prediabetes: Secondary | ICD-10-CM | POA: Diagnosis not present

## 2016-11-25 DIAGNOSIS — Z79899 Other long term (current) drug therapy: Secondary | ICD-10-CM | POA: Diagnosis not present

## 2016-11-25 MED FILL — LISINOPRIL-HCTZ 20-25 MG TA: 20-25 | 90 days supply | Qty: 90 | Fill #0

## 2016-12-24 DIAGNOSIS — H524 Presbyopia: Secondary | ICD-10-CM | POA: Diagnosis not present

## 2016-12-27 ENCOUNTER — Other Ambulatory Visit: Payer: Self-pay | Admitting: Family

## 2016-12-27 DIAGNOSIS — Z1231 Encounter for screening mammogram for malignant neoplasm of breast: Secondary | ICD-10-CM

## 2016-12-29 ENCOUNTER — Ambulatory Visit
Admission: RE | Admit: 2016-12-29 | Discharge: 2016-12-29 | Disposition: A | Discharge status: Home / Self Care | Payer: 59 | Source: Ambulatory Visit | Attending: Family | Admitting: Family

## 2016-12-29 DIAGNOSIS — Z1231 Encounter for screening mammogram for malignant neoplasm of breast: Secondary | ICD-10-CM

## 2017-01-27 ENCOUNTER — Ambulatory Visit: Payer: 59 | Admitting: Dietician

## 2017-02-04 ENCOUNTER — Encounter: Payer: Self-pay | Admitting: Dietician

## 2017-02-04 ENCOUNTER — Encounter: Payer: 59 | Attending: Internal Medicine | Admitting: Dietician

## 2017-02-04 DIAGNOSIS — R7303 Prediabetes: Secondary | ICD-10-CM | POA: Insufficient documentation

## 2017-02-04 DIAGNOSIS — Z713 Dietary counseling and surveillance: Secondary | ICD-10-CM | POA: Insufficient documentation

## 2017-02-04 NOTE — Patient Instructions (Signed)
Great job on the changes that you have made!  Water   Baked rather than fried  Lean meat and poultry without skin.  Increase of vegetables and salads. Continue to increase your intake of non-starchy vegetables. Choose whole grain less processed most often. Choose yogurt that has only about 15 grams carbohydrates per serving. Address your sleep with your doctor. Stay as active as you can.  Walking, PT exercises.  Small amounts as tolerated.  It all adds up. Consider a sublingual or dissolvable vitamin B-12 or have your MD check your levels. Consider checking your vitamin D levels and consider 1000 units of vitamin D3 daily.

## 2017-02-04 NOTE — Progress Notes (Signed)
Diabetes Self-Management Education  Visit Type: First/Initial  Appt. Start Time: 0800 Appt. End Time: 0900  02/04/2017  [name], identified by name and date of birth, is a 56 y.o. female with a diagnosis of Diabetes: Pre-Diabetes. Other hx includes HTN, GERD, vitamin B-12 deficiency with monthly shot in the past per patient.  She also has Degenerative Disk Disease and has been having problems with her back which makes it difficult to sleep.  She is getting around 5 hours of sleep per night.  Weight hx: 205 lbs today Usual low weight 190 lbs  120 lbs when she was in her 20's  Patient lives with her husband and daughter.  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  She is lactose intolerant and eats very few sweets.  ASSESSMENT  Height 5\' 2"  (1.575 m), weight 205 lb (93 kg). Body mass index is 37.49 kg/m.      Diabetes Self-Management Education - 02/04/17 0808      Visit Information   Visit Type First/Initial     Initial Visit   Diabetes Type Pre-Diabetes   Are you taking your medications as prescribed? Not on Medications   Date Diagnosed 11/2016     Health Coping   How would you rate your overall health? Fair     Psychosocial Assessment   Patient Belief/Attitude about Diabetes Motivated to manage diabetes   Self-care barriers None   Self-management support Doctor's office   Other persons present Patient   Patient Concerns Nutrition/Meal planning   Special Needs None   Preferred Learning Style No preference indicated   Learning Readiness Ready   How often do you need to have someone help you when you read instructions, pamphlets, or other written materials from your doctor or pharmacy? 1 - Never   What is the last grade level you completed in school? 12th grade     Pre-Education Assessment   Patient understands the diabetes disease and treatment process. Needs Instruction   Patient understands incorporating  nutritional management into lifestyle. Needs Instruction   Patient undertands incorporating physical activity into lifestyle. Needs Instruction   Patient understands using medications safely. Needs Instruction   Patient understands monitoring blood glucose, interpreting and using results Needs Instruction   Patient understands prevention, detection, and treatment of acute complications. Needs Instruction   Patient understands prevention, detection, and treatment of chronic complications. Needs Instruction   Patient understands how to develop strategies to address psychosocial issues. Needs Instruction   Patient understands how to develop strategies to promote health/change behavior. Needs Instruction     Complications   Last HgB A1C per patient/outside source 6 %  11/2016   Have you had a dilated eye exam in the past 12 months? No   Have you had a dental exam in the past 12 months? Yes   Are you checking your feet? Yes   How many days per week are you checking your feet? 7     Dietary Intake   Breakfast grits, eggs, sausage   Snack (morning) granola bar   Lunch [place] and cheese on [employer] bread   Snack (afternoon) occasional granola bar or saltines   Dinner rice, baked chicken, green beans   Snack (evening) not often   Beverage(s) water, seldom lemonade     Exercise   Exercise Type ADL's     Patient Education   Previous Diabetes Education No   Disease state  Other (comment)  prediabetes, insulin resistance and basic physiology   Nutrition management  Role of diet in the treatment of diabetes and the relationship between the three main macronutrients and blood glucose level;Meal options for control of blood glucose level and chronic complications.   Physical activity and exercise  Role of exercise on diabetes management, blood pressure control and cardiac health.;Helped patient identify appropriate exercises in relation to his/her diabetes, diabetes complications and other health issue.    Monitoring Identified appropriate SMBG and/or A1C goals.   Psychosocial adjustment Role of stress on diabetes;Worked with patient to identify barriers to care and solutions     Individualized Goals (developed by patient)   Nutrition General guidelines for healthy choices and portions discussed   Physical Activity Exercise 5-7 days per week;15 minutes per day   Medications Not Applicable   Monitoring  Not Applicable   Reducing Risk Other (comment)  staying active   Health Coping Not Applicable     Post-Education Assessment   Patient understands the diabetes disease and treatment process. Demonstrates understanding / competency   Patient understands incorporating nutritional management into lifestyle. Demonstrates understanding / competency   Patient undertands incorporating physical activity into lifestyle. Demonstrates understanding / competency   Patient understands using medications safely. Demonstrates understanding / competency   Patient understands monitoring blood glucose, interpreting and using results Demonstrates understanding / competency   Patient understands prevention, detection, and treatment of acute complications. Demonstrates understanding / competency   Patient understands prevention, detection, and treatment of chronic complications. Demonstrates understanding / competency   Patient understands how to develop strategies to address psychosocial issues. Demonstrates understanding / competency   Patient understands how to develop strategies to promote health/change behavior. Demonstrates understanding / competency     Outcomes   Expected Outcomes Demonstrated interest in learning. Expect positive outcomes   Future DMSE PRN   Program Status Completed      Individualized Plan for Diabetes Self-Management Training:   Learning Objective:  Patient will have a greater understanding of diabetes self-management. Patient education plan is to attend individual and/or group  sessions per assessed needs and concerns.   Plan:   Patient Instructions  Doristine Devoid job on the changes that you have made!  Water   Baked rather than fried  Lean meat and poultry without skin.  Increase of vegetables and salads. Continue to increase your intake of non-starchy vegetables. Choose whole grain less processed most often. Choose yogurt that has only about 15 grams carbohydrates per serving. Address your sleep with your doctor. Stay as active as you can.  Walking, PT exercises.  Small amounts as tolerated.  It all adds up. Consider a sublingual or dissolvable vitamin B-12 or have your MD check your levels. Consider checking your vitamin D levels and consider 1000 units of vitamin D3 daily.    Expected Outcomes:  Demonstrated interest in learning. Expect positive outcomes  Education material provided: Living Well with Diabetes, Food label handouts, A1C conversion sheet, Meal plan card, My Plate and Snack sheet  If problems or questions, patient to contact team via:  Phone  Future DSME appointment: PRN

## 2017-04-01 MED FILL — LISINOPRIL-HCTZ 20-25 MG TA: 20-25 | 90 days supply | Qty: 90 | Fill #1

## 2017-09-05 DIAGNOSIS — M5136 Other intervertebral disc degeneration, lumbar region: Secondary | ICD-10-CM | POA: Diagnosis not present

## 2017-09-05 DIAGNOSIS — R7303 Prediabetes: Secondary | ICD-10-CM | POA: Diagnosis not present

## 2017-09-05 DIAGNOSIS — I10 Essential (primary) hypertension: Secondary | ICD-10-CM | POA: Diagnosis not present

## 2017-09-05 DIAGNOSIS — M5416 Radiculopathy, lumbar region: Secondary | ICD-10-CM | POA: Diagnosis not present

## 2017-09-05 MED FILL — LISINOPRIL-HCTZ 20-25 MG TA: 20-25 | 90 days supply | Qty: 90 | Fill #0

## 2017-09-26 DIAGNOSIS — M48061 Spinal stenosis, lumbar region without neurogenic claudication: Secondary | ICD-10-CM | POA: Diagnosis not present

## 2017-09-26 DIAGNOSIS — M545 Low back pain: Secondary | ICD-10-CM | POA: Diagnosis not present

## 2017-10-18 DIAGNOSIS — M545 Low back pain: Secondary | ICD-10-CM | POA: Diagnosis not present

## 2017-11-15 DIAGNOSIS — M48061 Spinal stenosis, lumbar region without neurogenic claudication: Secondary | ICD-10-CM | POA: Diagnosis not present

## 2017-11-17 DIAGNOSIS — Z1211 Encounter for screening for malignant neoplasm of colon: Secondary | ICD-10-CM | POA: Diagnosis not present

## 2017-11-17 DIAGNOSIS — I10 Essential (primary) hypertension: Secondary | ICD-10-CM | POA: Diagnosis not present

## 2017-11-17 DIAGNOSIS — R42 Dizziness and giddiness: Secondary | ICD-10-CM | POA: Diagnosis not present

## 2017-11-17 MED FILL — LISINOPRIL-HCTZ 20-12.5 MG: 20-12.5 | 90 days supply | Qty: 90 | Fill #0

## 2017-11-26 DIAGNOSIS — M48061 Spinal stenosis, lumbar region without neurogenic claudication: Secondary | ICD-10-CM | POA: Diagnosis not present

## 2017-12-12 DIAGNOSIS — M545 Low back pain: Secondary | ICD-10-CM | POA: Diagnosis not present

## 2018-02-03 MED FILL — OLOPATADINE HCL 0.1% EYE DR: 0.1 | 25 days supply | Qty: 5 | Fill #0

## 2018-02-10 ENCOUNTER — Encounter: Payer: Self-pay | Admitting: Family Medicine

## 2018-02-10 ENCOUNTER — Ambulatory Visit (INDEPENDENT_AMBULATORY_CARE_PROVIDER_SITE_OTHER): Payer: Self-pay | Admitting: Family Medicine

## 2018-02-10 VITALS — BP 124/82 | HR 67 | Temp 98.9°F | Ht 66.0 in | Wt 203.6 lb

## 2018-02-10 DIAGNOSIS — Z0000 Encounter for general adult medical examination without abnormal findings: Secondary | ICD-10-CM

## 2018-02-10 NOTE — Progress Notes (Signed)
[name] is a 57 y.o. female who presents today with concerns of a need for a physical exam. She does have a PCP Dr. Lynelle Doctor at [facility] [address]. She denies any chronic conditions but does report being pre diabetic. She reports today she does not have acute problems she wants to address.  Review of Systems  Constitutional: Negative for chills, fever and malaise/fatigue.  HENT: Negative for congestion, ear discharge, ear pain, sinus pain and sore throat.   Eyes: Negative.   Respiratory: Negative for cough, sputum production and shortness of breath.   Cardiovascular: Negative.  Negative for chest pain.  Gastrointestinal: Negative for abdominal pain, diarrhea, nausea and vomiting.  Genitourinary: Negative for dysuria, frequency, hematuria and urgency.  Musculoskeletal: Negative for myalgias.  Skin: Negative.   Neurological: Negative for headaches.  Endo/Heme/Allergies: Negative.   Psychiatric/Behavioral: Negative.     O: Vitals:   02/10/18 0810  BP: 124/82  Pulse: 67  Temp: 98.9 F (37.2 C)  SpO2: 99%     Physical Exam  Constitutional: She is oriented to person, place, and time. Vital signs are normal. She appears well-developed and well-nourished. She is active.  Non-toxic appearance. She does not have a sickly appearance.  HENT:  Head: Normocephalic.  Right Ear: Hearing, tympanic membrane, external ear and ear canal normal.  Left Ear: Hearing, tympanic membrane, external ear and ear canal normal.  Nose: Nose normal.  Mouth/Throat: Uvula is midline and oropharynx is clear and moist.  Neck: Normal range of motion. Neck supple.  Cardiovascular: Normal rate, regular rhythm, normal heart sounds and normal pulses.  Pulmonary/Chest: Effort normal and breath sounds normal.  Abdominal: Soft. Bowel sounds are normal.  Musculoskeletal: Normal range of motion.  Lymphadenopathy:       Head (right side): No submental and no submandibular adenopathy present.       Head (left  side): No submental and no submandibular adenopathy present.    She has no cervical adenopathy.  Neurological: She is alert and oriented to person, place, and time.  Psychiatric: She has a normal mood and affect. Her speech is normal and behavior is normal. She exhibits abnormal recent memory.  PHQ-9 - negative Difficulty with cognitive recall at 2 minutes only recalled 1 out of 3 words  Vitals reviewed.  A: 1. Physical exam    P: Exam findings, diagnosis etiology and medication use and indications reviewed with patient. Follow- Up and discharge instructions provided. No emergent/urgent issues found on exam.  Patient verbalized understanding of information provided and agrees with plan of care (POC), all questions answered.  1. Physical exam WNL

## 2018-02-10 NOTE — Patient Instructions (Signed)

## 2018-03-29 MED FILL — LISINOPRIL-HCTZ 20-12.5 MG: 20-12.5 | 90 days supply | Qty: 90 | Fill #1

## 2018-05-23 DIAGNOSIS — R21 Rash and other nonspecific skin eruption: Secondary | ICD-10-CM | POA: Diagnosis not present

## 2018-05-23 DIAGNOSIS — Z23 Encounter for immunization: Secondary | ICD-10-CM | POA: Diagnosis not present

## 2018-05-23 MED FILL — KETOCONAZOLE 2% CREAM: 2 | 14 days supply | Qty: 60 | Fill #0

## 2018-06-19 MED FILL — LISINOPRIL-HCTZ 20-12.5 MG: 20-12.5 | 90 days supply | Qty: 90 | Fill #2

## 2018-09-13 MED FILL — OLOPATADINE HCL 0.1% EYE DR: 0.1 | 25 days supply | Qty: 5 | Fill #1

## 2018-10-03 MED FILL — OLOPATADINE HCL 0.1% EYE DR: 0.1 | 25 days supply | Qty: 5 | Fill #1

## 2018-10-17 MED FILL — LISINOPRIL-HCTZ 20-12.5 MG: 20-12.5 | 90 days supply | Qty: 90 | Fill #3

## 2019-01-19 DIAGNOSIS — R21 Rash and other nonspecific skin eruption: Secondary | ICD-10-CM | POA: Diagnosis not present

## 2019-01-19 DIAGNOSIS — G5600 Carpal tunnel syndrome, unspecified upper limb: Secondary | ICD-10-CM | POA: Diagnosis not present

## 2019-01-19 MED FILL — KETOCONAZOLE 2% CREAM: 2 | 14 days supply | Qty: 15 | Fill #0

## 2019-01-22 MED FILL — OLOPATADINE HCL 0.1% EYE DR: 0.1 | 25 days supply | Qty: 5 | Fill #2

## 2019-01-25 DIAGNOSIS — M25561 Pain in right knee: Secondary | ICD-10-CM | POA: Diagnosis not present

## 2019-01-25 DIAGNOSIS — Z23 Encounter for immunization: Secondary | ICD-10-CM | POA: Diagnosis not present

## 2019-01-25 DIAGNOSIS — G5602 Carpal tunnel syndrome, left upper limb: Secondary | ICD-10-CM | POA: Diagnosis not present

## 2019-01-25 DIAGNOSIS — R7303 Prediabetes: Secondary | ICD-10-CM | POA: Diagnosis not present

## 2019-01-25 DIAGNOSIS — Z0000 Encounter for general adult medical examination without abnormal findings: Secondary | ICD-10-CM | POA: Diagnosis not present

## 2019-01-25 DIAGNOSIS — I10 Essential (primary) hypertension: Secondary | ICD-10-CM | POA: Diagnosis not present

## 2019-01-25 DIAGNOSIS — E2839 Other primary ovarian failure: Secondary | ICD-10-CM | POA: Diagnosis not present

## 2019-01-25 DIAGNOSIS — M5136 Other intervertebral disc degeneration, lumbar region: Secondary | ICD-10-CM | POA: Diagnosis not present

## 2019-01-25 DIAGNOSIS — D649 Anemia, unspecified: Secondary | ICD-10-CM | POA: Diagnosis not present

## 2019-01-25 MED FILL — LISINOPRIL-HCTZ 20-12.5 MG: 20-12.5 | 90 days supply | Qty: 90 | Fill #0

## 2019-01-31 ENCOUNTER — Other Ambulatory Visit: Payer: Self-pay | Admitting: Family Medicine

## 2019-01-31 DIAGNOSIS — Z1231 Encounter for screening mammogram for malignant neoplasm of breast: Secondary | ICD-10-CM

## 2019-01-31 DIAGNOSIS — E2839 Other primary ovarian failure: Secondary | ICD-10-CM

## 2019-02-08 DIAGNOSIS — H524 Presbyopia: Secondary | ICD-10-CM | POA: Diagnosis not present

## 2019-02-08 DIAGNOSIS — H5203 Hypermetropia, bilateral: Secondary | ICD-10-CM | POA: Diagnosis not present

## 2019-03-06 ENCOUNTER — Other Ambulatory Visit: Payer: Self-pay

## 2019-03-06 DIAGNOSIS — Z20822 Contact with and (suspected) exposure to covid-19: Secondary | ICD-10-CM

## 2019-03-08 DIAGNOSIS — U071 COVID-19: Secondary | ICD-10-CM | POA: Diagnosis not present

## 2019-03-08 DIAGNOSIS — Z7189 Other specified counseling: Secondary | ICD-10-CM | POA: Diagnosis not present

## 2019-03-08 LAB — NOVEL CORONAVIRUS, NAA: SARS-CoV-2, NAA: DETECTED — AB

## 2019-03-13 ENCOUNTER — Other Ambulatory Visit: Payer: Self-pay

## 2019-03-13 DIAGNOSIS — Z20822 Contact with and (suspected) exposure to covid-19: Secondary | ICD-10-CM

## 2019-03-13 DIAGNOSIS — R6889 Other general symptoms and signs: Secondary | ICD-10-CM | POA: Diagnosis not present

## 2019-03-15 LAB — NOVEL CORONAVIRUS, NAA: SARS-CoV-2, NAA: NOT DETECTED

## 2019-04-19 ENCOUNTER — Other Ambulatory Visit: Payer: 59

## 2019-04-19 ENCOUNTER — Ambulatory Visit: Payer: 59

## 2019-05-17 DIAGNOSIS — M79672 Pain in left foot: Secondary | ICD-10-CM | POA: Diagnosis not present

## 2019-06-18 MED FILL — LISINOPRIL-HCTZ 20-12.5 MG: 20-12.5 | 90 days supply | Qty: 90 | Fill #1

## 2019-06-28 ENCOUNTER — Ambulatory Visit
Admission: RE | Admit: 2019-06-28 | Discharge: 2019-06-28 | Disposition: A | Discharge status: Home / Self Care | Payer: 59 | Source: Ambulatory Visit | Attending: Family Medicine | Admitting: Family Medicine

## 2019-06-28 ENCOUNTER — Other Ambulatory Visit: Payer: Self-pay

## 2019-06-28 DIAGNOSIS — Z1231 Encounter for screening mammogram for malignant neoplasm of breast: Secondary | ICD-10-CM

## 2019-06-28 DIAGNOSIS — E2839 Other primary ovarian failure: Secondary | ICD-10-CM

## 2019-06-28 DIAGNOSIS — Z13820 Encounter for screening for osteoporosis: Secondary | ICD-10-CM | POA: Diagnosis not present

## 2019-06-28 DIAGNOSIS — Z780 Asymptomatic menopausal state: Secondary | ICD-10-CM | POA: Diagnosis not present

## 2019-06-28 MED FILL — traMADol HCL 50 MG TABS: 50 | 4 days supply | Qty: 15 | Fill #0

## 2019-06-28 MED FILL — AMOXICILLIN 500 MG CAPSULE: 500 | 7 days supply | Qty: 21 | Fill #0

## 2019-08-31 DIAGNOSIS — Z1211 Encounter for screening for malignant neoplasm of colon: Secondary | ICD-10-CM | POA: Diagnosis not present

## 2019-08-31 DIAGNOSIS — N644 Mastodynia: Secondary | ICD-10-CM | POA: Diagnosis not present

## 2019-09-20 MED FILL — LISINOPRIL-HCTZ 20-12.5 MG: 20-12.5 | 90 days supply | Qty: 90 | Fill #0

## 2019-09-25 ENCOUNTER — Other Ambulatory Visit: Payer: Self-pay | Admitting: Family Medicine

## 2019-09-25 DIAGNOSIS — N644 Mastodynia: Secondary | ICD-10-CM

## 2019-10-15 ENCOUNTER — Other Ambulatory Visit: Payer: Self-pay

## 2019-10-15 ENCOUNTER — Ambulatory Visit: Payer: 59

## 2019-10-15 ENCOUNTER — Ambulatory Visit
Admission: RE | Admit: 2019-10-15 | Discharge: 2019-10-15 | Disposition: A | Discharge status: Home / Self Care | Payer: 59 | Source: Ambulatory Visit | Attending: Family Medicine | Admitting: Family Medicine

## 2019-10-15 DIAGNOSIS — N644 Mastodynia: Secondary | ICD-10-CM

## 2019-10-15 DIAGNOSIS — R928 Other abnormal and inconclusive findings on diagnostic imaging of breast: Secondary | ICD-10-CM | POA: Diagnosis not present

## 2019-11-28 DIAGNOSIS — Z1211 Encounter for screening for malignant neoplasm of colon: Secondary | ICD-10-CM | POA: Diagnosis not present

## 2019-12-04 MED FILL — GAVILYTE-G SOLUTION: 236 | 1 days supply | Qty: 4000 | Fill #0

## 2019-12-05 ENCOUNTER — Ambulatory Visit: Payer: 59 | Attending: Internal Medicine

## 2019-12-05 DIAGNOSIS — Z20822 Contact with and (suspected) exposure to covid-19: Secondary | ICD-10-CM | POA: Diagnosis not present

## 2019-12-06 LAB — SARS-COV-2, NAA 2 DAY TAT

## 2019-12-06 LAB — NOVEL CORONAVIRUS, NAA: SARS-CoV-2, NAA: NOT DETECTED

## 2019-12-10 DIAGNOSIS — K635 Polyp of colon: Secondary | ICD-10-CM | POA: Diagnosis not present

## 2019-12-10 DIAGNOSIS — D123 Benign neoplasm of transverse colon: Secondary | ICD-10-CM | POA: Diagnosis not present

## 2019-12-10 DIAGNOSIS — K621 Rectal polyp: Secondary | ICD-10-CM | POA: Diagnosis not present

## 2019-12-10 DIAGNOSIS — Z1211 Encounter for screening for malignant neoplasm of colon: Secondary | ICD-10-CM | POA: Diagnosis not present

## 2019-12-11 MED FILL — LISINOPRIL-HCTZ 20-12.5 MG: 20-12.5 | 90 days supply | Qty: 90 | Fill #1

## 2020-03-10 DIAGNOSIS — Z862 Personal history of diseases of the blood and blood-forming organs and certain disorders involving the immune mechanism: Secondary | ICD-10-CM | POA: Diagnosis not present

## 2020-03-10 DIAGNOSIS — M25569 Pain in unspecified knee: Secondary | ICD-10-CM | POA: Diagnosis not present

## 2020-03-10 DIAGNOSIS — I10 Essential (primary) hypertension: Secondary | ICD-10-CM | POA: Diagnosis not present

## 2020-03-10 DIAGNOSIS — Z0000 Encounter for general adult medical examination without abnormal findings: Secondary | ICD-10-CM | POA: Diagnosis not present

## 2020-03-10 DIAGNOSIS — M5136 Other intervertebral disc degeneration, lumbar region: Secondary | ICD-10-CM | POA: Diagnosis not present

## 2020-03-10 DIAGNOSIS — Z13220 Encounter for screening for lipoid disorders: Secondary | ICD-10-CM | POA: Diagnosis not present

## 2020-03-10 DIAGNOSIS — R7303 Prediabetes: Secondary | ICD-10-CM | POA: Diagnosis not present

## 2020-03-10 MED FILL — LISINOPRIL-HCTZ 20-12.5 MG: 20-12.5 | 90 days supply | Qty: 90 | Fill #0

## 2020-03-12 MED FILL — VIT D2 1.25 MG (50,000 UNIT: 1.25 MG | 56 days supply | Qty: 8 | Fill #0

## 2020-03-21 DIAGNOSIS — M25562 Pain in left knee: Secondary | ICD-10-CM | POA: Diagnosis not present

## 2020-03-21 DIAGNOSIS — M6281 Muscle weakness (generalized): Secondary | ICD-10-CM | POA: Diagnosis not present

## 2020-03-21 DIAGNOSIS — M25561 Pain in right knee: Secondary | ICD-10-CM | POA: Diagnosis not present

## 2020-04-01 DIAGNOSIS — M79671 Pain in right foot: Secondary | ICD-10-CM | POA: Diagnosis not present

## 2020-04-29 ENCOUNTER — Ambulatory Visit: Payer: 59 | Attending: Internal Medicine

## 2020-04-29 DIAGNOSIS — Z23 Encounter for immunization: Secondary | ICD-10-CM

## 2020-04-29 NOTE — Progress Notes (Signed)
   Covid-19 Vaccination Clinic  Name:  [name]    MRN: [id number] DOB: [birth year]  04/29/2020  [name] was observed post Covid-19 immunization for 15 minutes without incident. She was provided with Vaccine Information Sheet and instruction to access the V-Safe system.   [name] was instructed to call 911 with any severe reactions post vaccine: Marland Kitchen Difficulty breathing  . Swelling of face and throat  . A fast heartbeat  . A bad rash all over body  . Dizziness and weakness

## 2020-05-21 ENCOUNTER — Other Ambulatory Visit (HOSPITAL_COMMUNITY): Payer: Self-pay | Admitting: Family Medicine

## 2020-05-21 DIAGNOSIS — Z8269 Family history of other diseases of the musculoskeletal system and connective tissue: Secondary | ICD-10-CM | POA: Diagnosis not present

## 2020-05-21 DIAGNOSIS — M13179 Monoarthritis, not elsewhere classified, unspecified ankle and foot: Secondary | ICD-10-CM | POA: Diagnosis not present

## 2020-05-21 DIAGNOSIS — M79671 Pain in right foot: Secondary | ICD-10-CM | POA: Diagnosis not present

## 2020-05-22 MED FILL — DICLOFENAC SOD EC 75 MG TAB: 75 | 10 days supply | Qty: 20 | Fill #0

## 2020-05-23 DIAGNOSIS — R7989 Other specified abnormal findings of blood chemistry: Secondary | ICD-10-CM | POA: Diagnosis not present

## 2020-05-23 DIAGNOSIS — M13179 Monoarthritis, not elsewhere classified, unspecified ankle and foot: Secondary | ICD-10-CM | POA: Diagnosis not present

## 2020-05-23 DIAGNOSIS — E559 Vitamin D deficiency, unspecified: Secondary | ICD-10-CM | POA: Diagnosis not present

## 2020-05-23 DIAGNOSIS — M79671 Pain in right foot: Secondary | ICD-10-CM | POA: Diagnosis not present

## 2020-05-26 ENCOUNTER — Other Ambulatory Visit (HOSPITAL_COMMUNITY): Payer: Self-pay | Admitting: Family Medicine

## 2020-05-26 MED FILL — ALLOPURINOL 100 MG TABS: 100 | 30 days supply | Qty: 30 | Fill #0

## 2020-05-28 MED FILL — LISINOPRIL 20 MG TABLET: 20 | 30 days supply | Qty: 30 | Fill #0

## 2020-05-30 ENCOUNTER — Other Ambulatory Visit (HOSPITAL_COMMUNITY): Payer: Self-pay | Admitting: Family Medicine

## 2020-05-30 MED FILL — VIT D2 1.25 MG (50,000 UNIT: 1.25 MG | 28 days supply | Qty: 8 | Fill #0

## 2020-06-23 MED FILL — LISINOPRIL 20 MG TABLET: 20 | 30 days supply | Qty: 30 | Fill #1

## 2020-06-23 MED FILL — ALLOPURINOL 100 MG TABS: 100 | 30 days supply | Qty: 30 | Fill #1

## 2020-06-23 MED FILL — VIT D2 1.25 MG (50,000 UNIT: 1.25 MG | 28 days supply | Qty: 8 | Fill #1

## 2020-06-26 DIAGNOSIS — M109 Gout, unspecified: Secondary | ICD-10-CM | POA: Diagnosis not present

## 2020-06-26 DIAGNOSIS — E559 Vitamin D deficiency, unspecified: Secondary | ICD-10-CM | POA: Diagnosis not present

## 2020-06-26 DIAGNOSIS — R7303 Prediabetes: Secondary | ICD-10-CM | POA: Diagnosis not present

## 2020-06-26 DIAGNOSIS — I10 Essential (primary) hypertension: Secondary | ICD-10-CM | POA: Diagnosis not present

## 2020-06-26 DIAGNOSIS — Z862 Personal history of diseases of the blood and blood-forming organs and certain disorders involving the immune mechanism: Secondary | ICD-10-CM | POA: Diagnosis not present

## 2020-06-26 DIAGNOSIS — L732 Hidradenitis suppurativa: Secondary | ICD-10-CM | POA: Diagnosis not present

## 2020-06-26 DIAGNOSIS — E790 Hyperuricemia without signs of inflammatory arthritis and tophaceous disease: Secondary | ICD-10-CM | POA: Diagnosis not present

## 2020-06-26 DIAGNOSIS — R030 Elevated blood-pressure reading, without diagnosis of hypertension: Secondary | ICD-10-CM | POA: Diagnosis not present

## 2020-07-08 ENCOUNTER — Other Ambulatory Visit (HOSPITAL_COMMUNITY): Payer: Self-pay | Admitting: Family Medicine

## 2020-07-08 DIAGNOSIS — I10 Essential (primary) hypertension: Secondary | ICD-10-CM | POA: Diagnosis not present

## 2020-07-08 DIAGNOSIS — E559 Vitamin D deficiency, unspecified: Secondary | ICD-10-CM | POA: Diagnosis not present

## 2020-07-08 DIAGNOSIS — L732 Hidradenitis suppurativa: Secondary | ICD-10-CM | POA: Diagnosis not present

## 2020-07-08 DIAGNOSIS — Z862 Personal history of diseases of the blood and blood-forming organs and certain disorders involving the immune mechanism: Secondary | ICD-10-CM | POA: Diagnosis not present

## 2020-07-08 DIAGNOSIS — R7303 Prediabetes: Secondary | ICD-10-CM | POA: Diagnosis not present

## 2020-07-08 DIAGNOSIS — E790 Hyperuricemia without signs of inflammatory arthritis and tophaceous disease: Secondary | ICD-10-CM | POA: Diagnosis not present

## 2020-07-08 MED FILL — LISINOPRIL-HCTZ 20-12.5 MG: 20-12.5 | 30 days supply | Qty: 30 | Fill #0

## 2020-07-08 MED FILL — CEPHALEXIN 500 MG CAPSULE: 500 | 7 days supply | Qty: 28 | Fill #0

## 2020-07-08 MED FILL — MUPIROCIN 2% OINTMENT: 2 | 7 days supply | Qty: 44 | Fill #0

## 2020-07-15 MED FILL — VIT D2 1.25 MG (50,000 UNIT: 1.25 MG | 84 days supply | Qty: 24 | Fill #0

## 2020-08-14 ENCOUNTER — Other Ambulatory Visit (HOSPITAL_COMMUNITY): Payer: Self-pay | Admitting: Family Medicine

## 2020-08-14 DIAGNOSIS — Z862 Personal history of diseases of the blood and blood-forming organs and certain disorders involving the immune mechanism: Secondary | ICD-10-CM | POA: Diagnosis not present

## 2020-08-14 DIAGNOSIS — L732 Hidradenitis suppurativa: Secondary | ICD-10-CM | POA: Diagnosis not present

## 2020-08-14 DIAGNOSIS — R7303 Prediabetes: Secondary | ICD-10-CM | POA: Diagnosis not present

## 2020-08-14 DIAGNOSIS — E559 Vitamin D deficiency, unspecified: Secondary | ICD-10-CM | POA: Diagnosis not present

## 2020-08-14 DIAGNOSIS — I10 Essential (primary) hypertension: Secondary | ICD-10-CM | POA: Diagnosis not present

## 2020-08-14 DIAGNOSIS — E790 Hyperuricemia without signs of inflammatory arthritis and tophaceous disease: Secondary | ICD-10-CM | POA: Diagnosis not present

## 2020-08-14 MED FILL — LISINOPRIL-HCTZ 20-12.5 MG: 20-12.5 | 90 days supply | Qty: 90 | Fill #0

## 2020-08-29 MED FILL — ALLOPURINOL 100 MG TABS: 100 | 30 days supply | Qty: 30 | Fill #2

## 2020-09-25 MED FILL — VIT D2 1.25 MG (50,000 UNIT: 1.25 MG | 84 days supply | Qty: 24 | Fill #1

## 2020-10-16 MED FILL — MUPIROCIN 2% OINTMENT: 2 | 7 days supply | Qty: 44 | Fill #1

## 2020-11-03 MED FILL — ALLOPURINOL 100 MG TABS: 100 | 30 days supply | Qty: 30 | Fill #3

## 2020-12-13 MED FILL — Ergocalciferol Cap 1.25 MG (50000 Unit): ORAL | 84 days supply | Qty: 24 | Fill #0 | Status: AC

## 2020-12-13 MED FILL — Lisinopril & Hydrochlorothiazide Tab 20-12.5 MG: ORAL | 90 days supply | Qty: 90 | Fill #0 | Status: AC

## 2020-12-15 ENCOUNTER — Other Ambulatory Visit (HOSPITAL_COMMUNITY): Payer: Self-pay

## 2021-01-04 ENCOUNTER — Other Ambulatory Visit (HOSPITAL_COMMUNITY): Payer: Self-pay

## 2021-01-05 ENCOUNTER — Other Ambulatory Visit (HOSPITAL_COMMUNITY): Payer: Self-pay

## 2021-01-05 MED ORDER — ALLOPURINOL 100 MG PO TABS
100.0000 mg | ORAL_TABLET | Freq: Every day | ORAL | 0 refills | Status: DC
  Filled 2021-01-05: qty 90, 90d supply, fill #0

## 2021-03-12 ENCOUNTER — Other Ambulatory Visit (HOSPITAL_COMMUNITY): Payer: Self-pay

## 2021-03-12 DIAGNOSIS — L732 Hidradenitis suppurativa: Secondary | ICD-10-CM | POA: Diagnosis not present

## 2021-03-12 DIAGNOSIS — I10 Essential (primary) hypertension: Secondary | ICD-10-CM | POA: Diagnosis not present

## 2021-03-12 DIAGNOSIS — Z23 Encounter for immunization: Secondary | ICD-10-CM | POA: Diagnosis not present

## 2021-03-12 DIAGNOSIS — Z862 Personal history of diseases of the blood and blood-forming organs and certain disorders involving the immune mechanism: Secondary | ICD-10-CM | POA: Diagnosis not present

## 2021-03-12 DIAGNOSIS — E790 Hyperuricemia without signs of inflammatory arthritis and tophaceous disease: Secondary | ICD-10-CM | POA: Diagnosis not present

## 2021-03-12 DIAGNOSIS — R7303 Prediabetes: Secondary | ICD-10-CM | POA: Diagnosis not present

## 2021-03-12 DIAGNOSIS — E559 Vitamin D deficiency, unspecified: Secondary | ICD-10-CM | POA: Diagnosis not present

## 2021-03-12 DIAGNOSIS — Z0000 Encounter for general adult medical examination without abnormal findings: Secondary | ICD-10-CM | POA: Diagnosis not present

## 2021-03-12 MED ORDER — VITAMIN D (ERGOCALCIFEROL) 1.25 MG (50000 UNIT) PO CAPS
50000.0000 [IU] | ORAL_CAPSULE | ORAL | 3 refills | Status: DC
  Filled 2021-03-12: qty 24, 84d supply, fill #0
  Filled 2021-06-15: qty 24, 84d supply, fill #1
  Filled 2021-08-23: qty 24, 84d supply, fill #2
  Filled 2021-12-01: qty 24, 84d supply, fill #3
  Filled 2022-02-26: qty 24, 84d supply, fill #4

## 2021-03-12 MED ORDER — LISINOPRIL-HYDROCHLOROTHIAZIDE 20-12.5 MG PO TABS
1.0000 | ORAL_TABLET | Freq: Every day | ORAL | 3 refills | Status: DC
  Filled 2021-03-12: qty 90, 90d supply, fill #0
  Filled 2021-06-15: qty 90, 90d supply, fill #1
  Filled 2021-08-23: qty 90, 90d supply, fill #2
  Filled 2021-12-01: qty 90, 90d supply, fill #3

## 2021-03-13 ENCOUNTER — Other Ambulatory Visit (HOSPITAL_COMMUNITY): Payer: Self-pay

## 2021-03-13 MED ORDER — POTASSIUM CHLORIDE CRYS ER 20 MEQ PO TBCR
20.0000 meq | EXTENDED_RELEASE_TABLET | Freq: Every day | ORAL | 0 refills | Status: DC
  Filled 2021-03-13: qty 5, 5d supply, fill #0

## 2021-03-19 DIAGNOSIS — I10 Essential (primary) hypertension: Secondary | ICD-10-CM | POA: Diagnosis not present

## 2021-03-19 DIAGNOSIS — R7303 Prediabetes: Secondary | ICD-10-CM | POA: Diagnosis not present

## 2021-03-19 DIAGNOSIS — E790 Hyperuricemia without signs of inflammatory arthritis and tophaceous disease: Secondary | ICD-10-CM | POA: Diagnosis not present

## 2021-03-19 DIAGNOSIS — Z0000 Encounter for general adult medical examination without abnormal findings: Secondary | ICD-10-CM | POA: Diagnosis not present

## 2021-03-19 DIAGNOSIS — E559 Vitamin D deficiency, unspecified: Secondary | ICD-10-CM | POA: Diagnosis not present

## 2021-03-19 DIAGNOSIS — L732 Hidradenitis suppurativa: Secondary | ICD-10-CM | POA: Diagnosis not present

## 2021-03-19 DIAGNOSIS — Z23 Encounter for immunization: Secondary | ICD-10-CM | POA: Diagnosis not present

## 2021-03-19 DIAGNOSIS — Z862 Personal history of diseases of the blood and blood-forming organs and certain disorders involving the immune mechanism: Secondary | ICD-10-CM | POA: Diagnosis not present

## 2021-05-04 ENCOUNTER — Other Ambulatory Visit (HOSPITAL_COMMUNITY): Payer: Self-pay

## 2021-05-06 ENCOUNTER — Other Ambulatory Visit (HOSPITAL_COMMUNITY): Payer: Self-pay

## 2021-05-06 MED ORDER — ALLOPURINOL 100 MG PO TABS
100.0000 mg | ORAL_TABLET | Freq: Every day | ORAL | 1 refills | Status: AC
  Filled 2021-05-06: qty 90, 90d supply, fill #0
  Filled 2021-08-23: qty 90, 90d supply, fill #1

## 2021-05-11 ENCOUNTER — Other Ambulatory Visit (HOSPITAL_COMMUNITY): Payer: Self-pay

## 2021-05-11 MED FILL — Mupirocin Oint 2%: CUTANEOUS | 7 days supply | Qty: 44 | Fill #0 | Status: CN

## 2021-05-11 MED FILL — Mupirocin Oint 2%: CUTANEOUS | 7 days supply | Qty: 44 | Fill #0 | Status: AC

## 2021-06-04 ENCOUNTER — Other Ambulatory Visit: Payer: Self-pay | Admitting: Family Medicine

## 2021-06-04 DIAGNOSIS — Z1231 Encounter for screening mammogram for malignant neoplasm of breast: Secondary | ICD-10-CM

## 2021-06-15 ENCOUNTER — Other Ambulatory Visit (HOSPITAL_COMMUNITY): Payer: Self-pay

## 2021-06-22 DIAGNOSIS — E876 Hypokalemia: Secondary | ICD-10-CM | POA: Diagnosis not present

## 2021-06-22 DIAGNOSIS — L732 Hidradenitis suppurativa: Secondary | ICD-10-CM | POA: Diagnosis not present

## 2021-06-22 DIAGNOSIS — I10 Essential (primary) hypertension: Secondary | ICD-10-CM | POA: Diagnosis not present

## 2021-06-22 DIAGNOSIS — E559 Vitamin D deficiency, unspecified: Secondary | ICD-10-CM | POA: Diagnosis not present

## 2021-06-22 DIAGNOSIS — Z1231 Encounter for screening mammogram for malignant neoplasm of breast: Secondary | ICD-10-CM | POA: Diagnosis not present

## 2021-06-22 DIAGNOSIS — R7303 Prediabetes: Secondary | ICD-10-CM | POA: Diagnosis not present

## 2021-06-22 DIAGNOSIS — E790 Hyperuricemia without signs of inflammatory arthritis and tophaceous disease: Secondary | ICD-10-CM | POA: Diagnosis not present

## 2021-06-22 DIAGNOSIS — Z23 Encounter for immunization: Secondary | ICD-10-CM | POA: Diagnosis not present

## 2021-06-22 DIAGNOSIS — Z862 Personal history of diseases of the blood and blood-forming organs and certain disorders involving the immune mechanism: Secondary | ICD-10-CM | POA: Diagnosis not present

## 2021-07-06 ENCOUNTER — Ambulatory Visit
Admission: RE | Admit: 2021-07-06 | Discharge: 2021-07-06 | Disposition: A | Discharge status: Home / Self Care | Payer: 59 | Source: Ambulatory Visit | Attending: Family Medicine | Admitting: Family Medicine

## 2021-07-06 ENCOUNTER — Other Ambulatory Visit: Payer: Self-pay

## 2021-07-06 DIAGNOSIS — Z1231 Encounter for screening mammogram for malignant neoplasm of breast: Secondary | ICD-10-CM

## 2021-08-24 ENCOUNTER — Other Ambulatory Visit (HOSPITAL_COMMUNITY): Payer: Self-pay

## 2021-08-25 ENCOUNTER — Other Ambulatory Visit (HOSPITAL_COMMUNITY): Payer: Self-pay

## 2021-08-26 ENCOUNTER — Other Ambulatory Visit (HOSPITAL_COMMUNITY): Payer: Self-pay

## 2021-08-27 DIAGNOSIS — H2513 Age-related nuclear cataract, bilateral: Secondary | ICD-10-CM | POA: Diagnosis not present

## 2021-08-27 DIAGNOSIS — H5203 Hypermetropia, bilateral: Secondary | ICD-10-CM | POA: Diagnosis not present

## 2021-08-27 DIAGNOSIS — I10 Essential (primary) hypertension: Secondary | ICD-10-CM | POA: Diagnosis not present

## 2021-09-17 DIAGNOSIS — E559 Vitamin D deficiency, unspecified: Secondary | ICD-10-CM | POA: Diagnosis not present

## 2021-09-17 DIAGNOSIS — R7303 Prediabetes: Secondary | ICD-10-CM | POA: Diagnosis not present

## 2021-09-17 DIAGNOSIS — I10 Essential (primary) hypertension: Secondary | ICD-10-CM | POA: Diagnosis not present

## 2021-09-17 DIAGNOSIS — E790 Hyperuricemia without signs of inflammatory arthritis and tophaceous disease: Secondary | ICD-10-CM | POA: Diagnosis not present

## 2021-09-17 DIAGNOSIS — Z862 Personal history of diseases of the blood and blood-forming organs and certain disorders involving the immune mechanism: Secondary | ICD-10-CM | POA: Diagnosis not present

## 2021-09-17 DIAGNOSIS — E876 Hypokalemia: Secondary | ICD-10-CM | POA: Diagnosis not present

## 2021-09-19 IMAGING — MG DIGITAL DIAGNOSTIC BILAT W/ TOMO W/ CAD
8 series · 8 of 24 positions shown · non-contrast
Comparison: Previous exam(s).

CLINICAL DATA: Intermittent nonfocal pain in both breasts for the
past 4 months. The pain is greater on the left. She only feels the
pain when reaching with her arms. No tenderness to palpation. No
palpable mass.

EXAM:
DIGITAL DIAGNOSTIC BILATERAL MAMMOGRAM WITH CAD AND TOMO

[L MLO synth-2D]
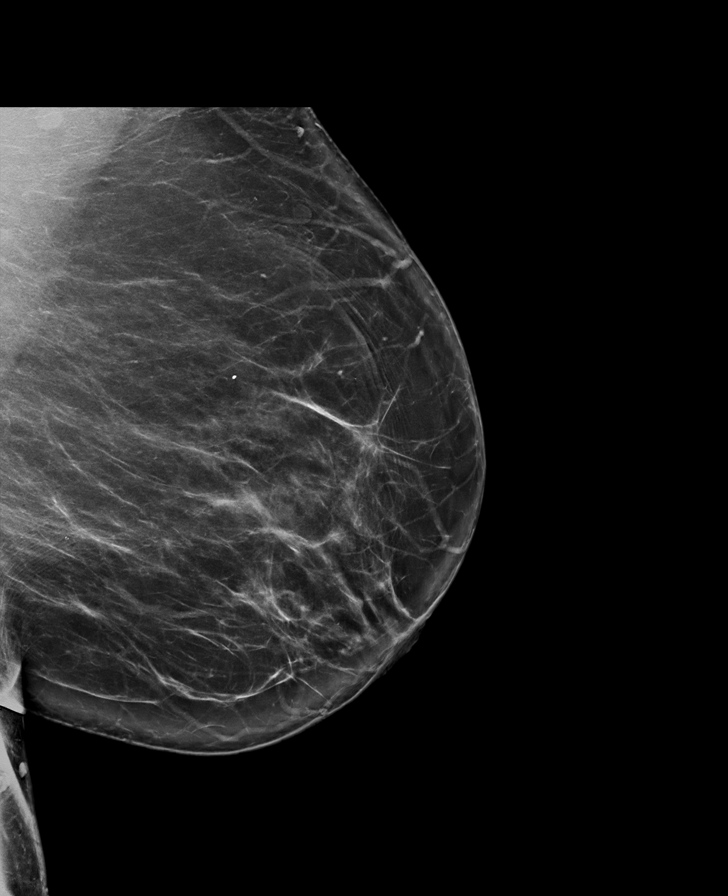

[R CC synth-2D]
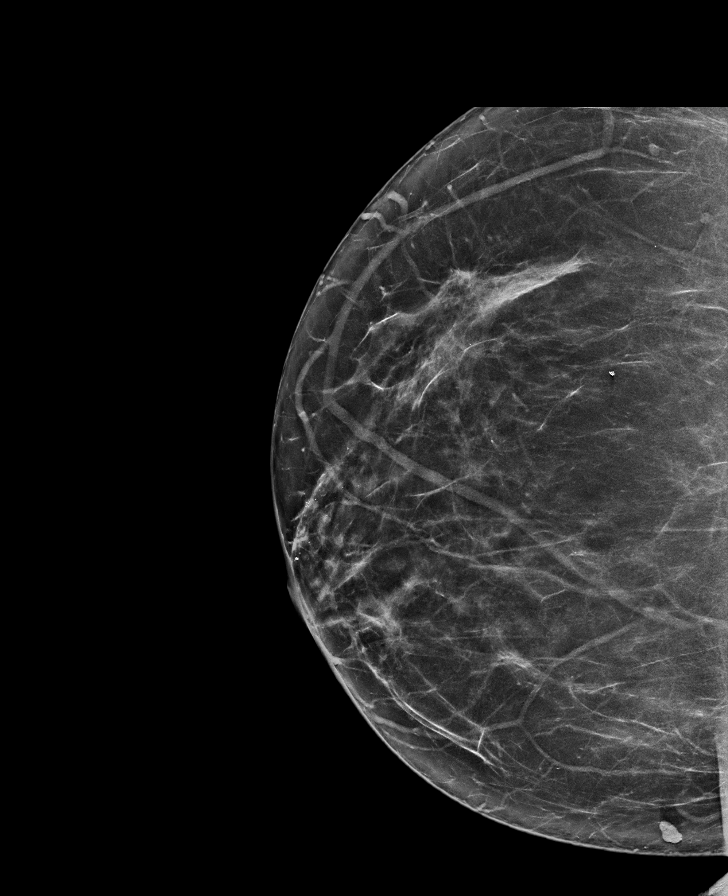

[L CC synth-2D]
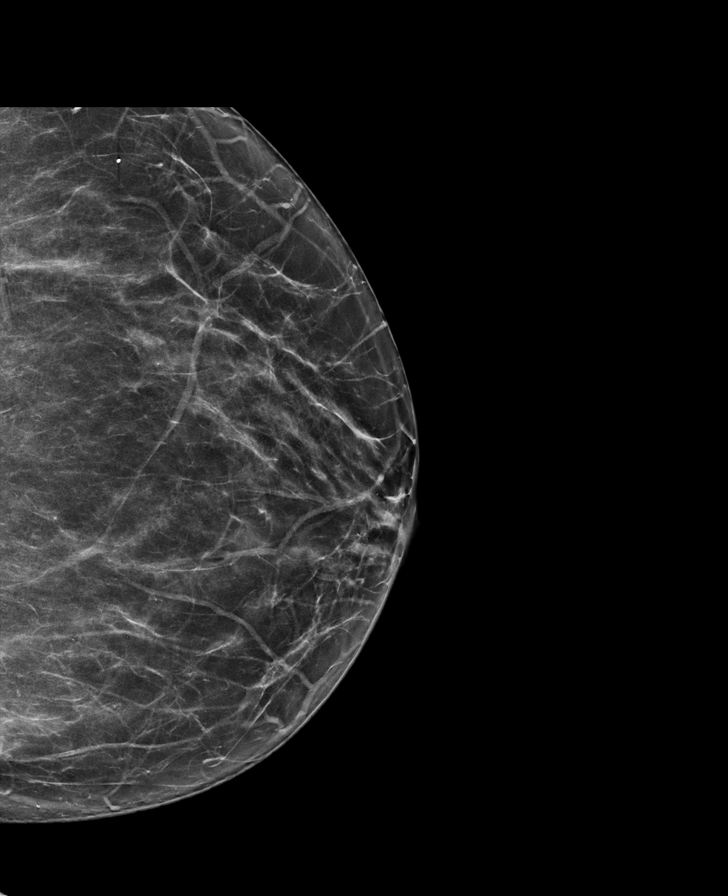

[R MLO synth-2D]
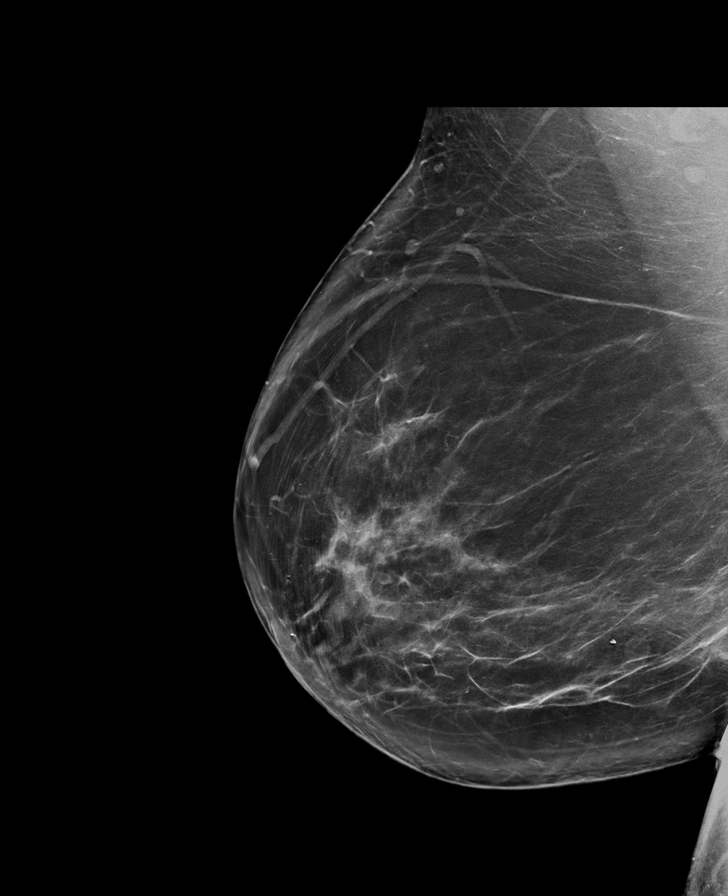

[L MLO tomo · tomo slice 49/96.0]
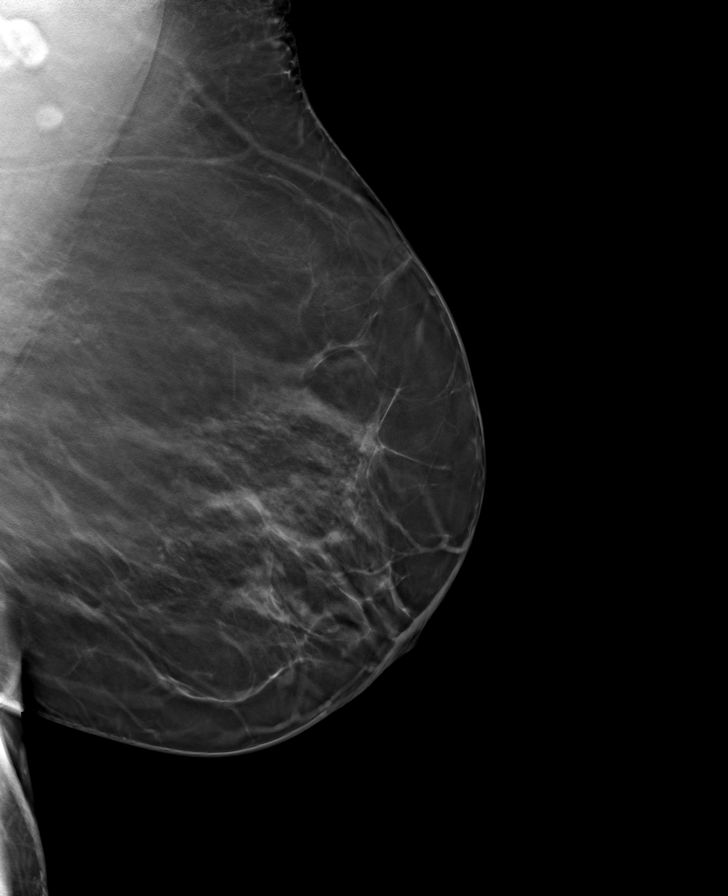

[L CC tomo · tomo slice 35/70.0]
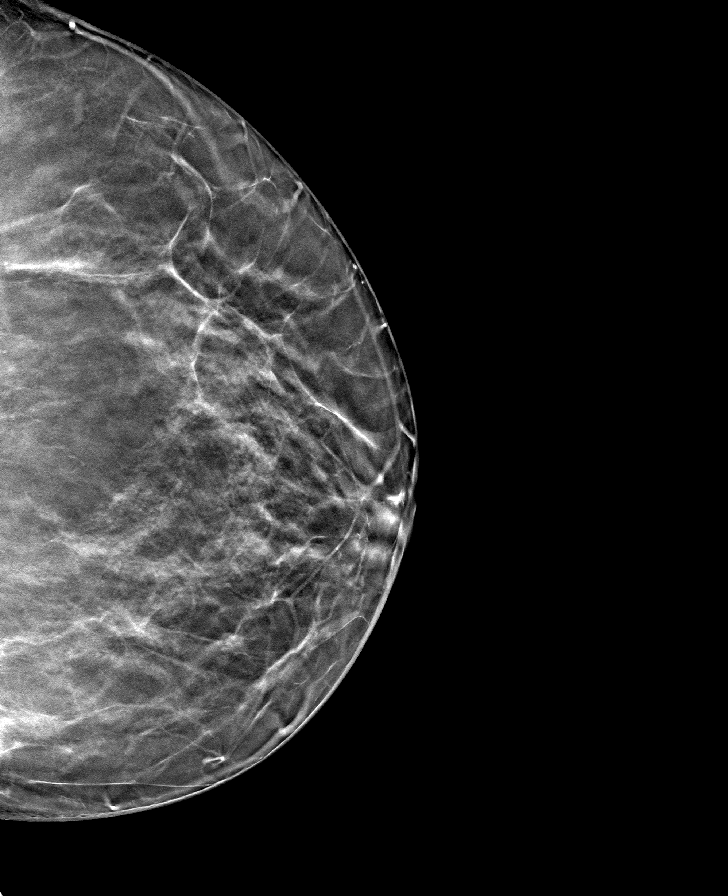

[R CC tomo · tomo slice 37/72.0]
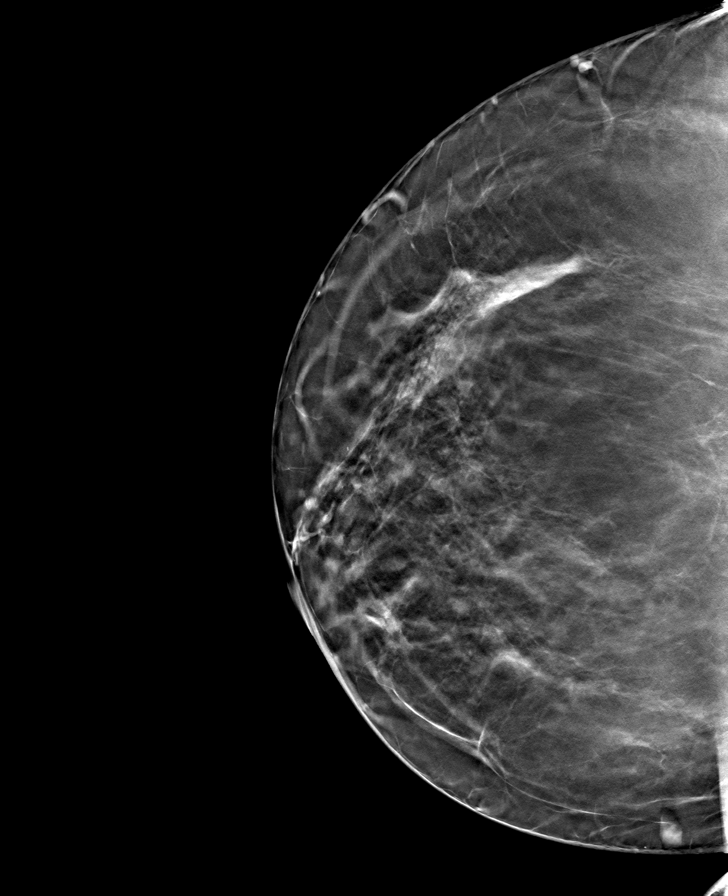

[R MLO tomo · tomo slice 53/104.0]
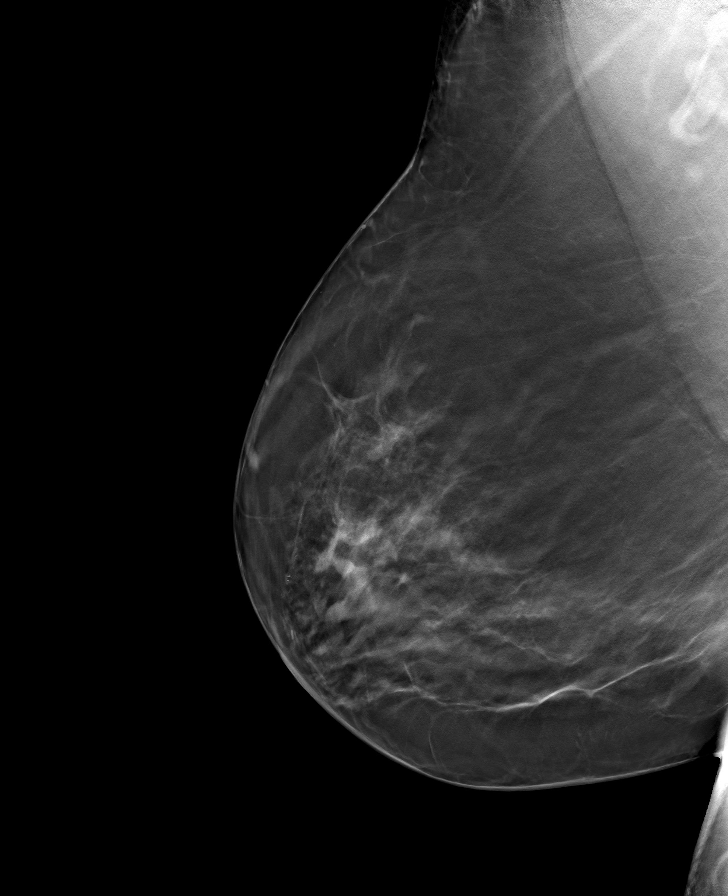

[8 of 24 positions shown; findings below may reference images not displayed]

ACR Breast Density Category b: There are scattered areas of
fibroglandular density.
FINDINGS: Stable mammographic appearance of the breasts with no interval
findings suspicious for malignancy in either breast.

Mammographic images were processed with CAD.
IMPRESSION: No evidence of malignancy.

RECOMMENDATION:
Bilateral screening mammogram in 1 year.

I have discussed the findings and recommendations with the patient.
If applicable, a reminder letter will be sent to the patient
regarding the next appointment.

BI-RADS CATEGORY  1: Negative.

## 2021-11-30 ENCOUNTER — Other Ambulatory Visit (HOSPITAL_COMMUNITY): Payer: Self-pay

## 2021-12-01 ENCOUNTER — Other Ambulatory Visit (HOSPITAL_COMMUNITY): Payer: Self-pay

## 2021-12-02 ENCOUNTER — Other Ambulatory Visit (HOSPITAL_COMMUNITY): Payer: Self-pay

## 2021-12-02 MED ORDER — ALLOPURINOL 100 MG PO TABS
100.0000 mg | ORAL_TABLET | Freq: Every day | ORAL | 1 refills | Status: DC
  Filled 2021-12-02: qty 90, 90d supply, fill #0
  Filled 2022-02-26: qty 90, 90d supply, fill #1

## 2022-02-26 ENCOUNTER — Other Ambulatory Visit (HOSPITAL_COMMUNITY): Payer: Self-pay

## 2022-03-02 ENCOUNTER — Other Ambulatory Visit (HOSPITAL_COMMUNITY): Payer: Self-pay

## 2022-03-05 ENCOUNTER — Other Ambulatory Visit (HOSPITAL_COMMUNITY): Payer: Self-pay

## 2022-03-08 ENCOUNTER — Other Ambulatory Visit (HOSPITAL_COMMUNITY): Payer: Self-pay

## 2022-03-08 MED ORDER — LISINOPRIL-HYDROCHLOROTHIAZIDE 20-12.5 MG PO TABS
1.0000 | ORAL_TABLET | Freq: Every day | ORAL | 0 refills | Status: DC
  Filled 2022-03-08: qty 90, 90d supply, fill #0

## 2022-03-08 MED ORDER — LISINOPRIL-HYDROCHLOROTHIAZIDE 20-12.5 MG PO TABS: 1.0000 | ORAL_TABLET | Freq: Every day | ORAL | 0 refills | Status: DC

## 2022-04-09 DIAGNOSIS — Z6830 Body mass index (BMI) 30.0-30.9, adult: Secondary | ICD-10-CM | POA: Diagnosis not present

## 2022-04-09 DIAGNOSIS — E790 Hyperuricemia without signs of inflammatory arthritis and tophaceous disease: Secondary | ICD-10-CM | POA: Diagnosis not present

## 2022-04-09 DIAGNOSIS — E559 Vitamin D deficiency, unspecified: Secondary | ICD-10-CM | POA: Diagnosis not present

## 2022-04-09 DIAGNOSIS — Z862 Personal history of diseases of the blood and blood-forming organs and certain disorders involving the immune mechanism: Secondary | ICD-10-CM | POA: Diagnosis not present

## 2022-04-09 DIAGNOSIS — I10 Essential (primary) hypertension: Secondary | ICD-10-CM | POA: Diagnosis not present

## 2022-04-29 DIAGNOSIS — Z23 Encounter for immunization: Secondary | ICD-10-CM | POA: Diagnosis not present

## 2022-04-29 DIAGNOSIS — R7303 Prediabetes: Secondary | ICD-10-CM | POA: Diagnosis not present

## 2022-04-29 DIAGNOSIS — E559 Vitamin D deficiency, unspecified: Secondary | ICD-10-CM | POA: Diagnosis not present

## 2022-04-29 DIAGNOSIS — E790 Hyperuricemia without signs of inflammatory arthritis and tophaceous disease: Secondary | ICD-10-CM | POA: Diagnosis not present

## 2022-04-29 DIAGNOSIS — Z0000 Encounter for general adult medical examination without abnormal findings: Secondary | ICD-10-CM | POA: Diagnosis not present

## 2022-04-29 DIAGNOSIS — Z862 Personal history of diseases of the blood and blood-forming organs and certain disorders involving the immune mechanism: Secondary | ICD-10-CM | POA: Diagnosis not present

## 2022-04-29 DIAGNOSIS — Z6832 Body mass index (BMI) 32.0-32.9, adult: Secondary | ICD-10-CM | POA: Diagnosis not present

## 2022-04-29 DIAGNOSIS — I10 Essential (primary) hypertension: Secondary | ICD-10-CM | POA: Diagnosis not present

## 2022-04-29 DIAGNOSIS — E876 Hypokalemia: Secondary | ICD-10-CM | POA: Diagnosis not present

## 2022-06-18 ENCOUNTER — Other Ambulatory Visit: Payer: Self-pay | Admitting: Family Medicine

## 2022-06-18 DIAGNOSIS — Z1231 Encounter for screening mammogram for malignant neoplasm of breast: Secondary | ICD-10-CM

## 2022-06-23 ENCOUNTER — Other Ambulatory Visit (HOSPITAL_COMMUNITY): Payer: Self-pay

## 2022-06-23 MED ORDER — VITAMIN D (ERGOCALCIFEROL) 1.25 MG (50000 UNIT) PO CAPS
50000.0000 [IU] | ORAL_CAPSULE | ORAL | 1 refills | Status: DC
  Filled 2022-06-23: qty 12, 84d supply, fill #0
  Filled 2022-09-28: qty 12, 84d supply, fill #1

## 2022-06-23 MED ORDER — LISINOPRIL-HYDROCHLOROTHIAZIDE 20-12.5 MG PO TABS
1.0000 | ORAL_TABLET | Freq: Every day | ORAL | 1 refills | Status: DC
  Filled 2022-06-23: qty 90, 90d supply, fill #0
  Filled 2022-09-28: qty 90, 90d supply, fill #1

## 2022-06-23 MED ORDER — ALLOPURINOL 100 MG PO TABS
100.0000 mg | ORAL_TABLET | Freq: Every day | ORAL | 2 refills | Status: DC
Start: 2022-06-23 — End: 2023-04-14
  Filled 2022-06-23: qty 90, 90d supply, fill #0
  Filled 2022-09-28: qty 90, 90d supply, fill #1
  Filled 2023-01-11: qty 90, 90d supply, fill #2

## 2022-07-13 ENCOUNTER — Ambulatory Visit
Admission: RE | Admit: 2022-07-13 | Discharge: 2022-07-13 | Disposition: A | Discharge status: Home / Self Care | Payer: 59 | Source: Ambulatory Visit | Attending: Family Medicine | Admitting: Family Medicine

## 2022-07-13 DIAGNOSIS — Z1231 Encounter for screening mammogram for malignant neoplasm of breast: Secondary | ICD-10-CM | POA: Diagnosis not present

## 2022-07-15 ENCOUNTER — Other Ambulatory Visit: Payer: Self-pay | Admitting: Family Medicine

## 2022-07-15 DIAGNOSIS — R928 Other abnormal and inconclusive findings on diagnostic imaging of breast: Secondary | ICD-10-CM

## 2022-07-26 ENCOUNTER — Other Ambulatory Visit: Payer: Self-pay | Admitting: Family Medicine

## 2022-07-26 ENCOUNTER — Ambulatory Visit
Admission: RE | Admit: 2022-07-26 | Discharge: 2022-07-26 | Disposition: A | Discharge status: Home / Self Care | Payer: 59 | Source: Ambulatory Visit | Attending: Family Medicine | Admitting: Family Medicine

## 2022-07-26 DIAGNOSIS — R928 Other abnormal and inconclusive findings on diagnostic imaging of breast: Secondary | ICD-10-CM | POA: Diagnosis not present

## 2022-07-26 DIAGNOSIS — R921 Mammographic calcification found on diagnostic imaging of breast: Secondary | ICD-10-CM

## 2022-08-11 ENCOUNTER — Ambulatory Visit
Admission: RE | Admit: 2022-08-11 | Discharge: 2022-08-11 | Disposition: A | Discharge status: Home / Self Care | Payer: 59 | Source: Ambulatory Visit | Attending: Family Medicine | Admitting: Family Medicine

## 2022-08-11 DIAGNOSIS — D0511 Intraductal carcinoma in situ of right breast: Secondary | ICD-10-CM | POA: Diagnosis not present

## 2022-08-11 DIAGNOSIS — R921 Mammographic calcification found on diagnostic imaging of breast: Secondary | ICD-10-CM

## 2022-08-11 HISTORY — PX: BREAST BIOPSY: SHX20

## 2022-08-23 ENCOUNTER — Ambulatory Visit: Payer: Self-pay | Admitting: Surgery

## 2022-08-23 DIAGNOSIS — D0511 Intraductal carcinoma in situ of right breast: Secondary | ICD-10-CM | POA: Diagnosis not present

## 2022-08-27 ENCOUNTER — Other Ambulatory Visit: Payer: Self-pay | Admitting: Surgery

## 2022-08-27 DIAGNOSIS — D0511 Intraductal carcinoma in situ of right breast: Secondary | ICD-10-CM

## 2022-08-30 ENCOUNTER — Encounter: Payer: Self-pay | Admitting: Hematology and Oncology

## 2022-08-30 ENCOUNTER — Inpatient Hospital Stay: Payer: Commercial Managed Care - PPO | Attending: Hematology and Oncology | Admitting: Hematology and Oncology

## 2022-08-30 ENCOUNTER — Inpatient Hospital Stay: Payer: Commercial Managed Care - PPO

## 2022-08-30 VITALS — BP 142/67 | HR 75 | Temp 99.7°F | Resp 18 | Ht 66.0 in | Wt 178.9 lb

## 2022-08-30 DIAGNOSIS — D509 Iron deficiency anemia, unspecified: Secondary | ICD-10-CM | POA: Insufficient documentation

## 2022-08-30 DIAGNOSIS — D0510 Intraductal carcinoma in situ of unspecified breast: Secondary | ICD-10-CM | POA: Insufficient documentation

## 2022-08-30 DIAGNOSIS — I10 Essential (primary) hypertension: Secondary | ICD-10-CM | POA: Diagnosis not present

## 2022-08-30 DIAGNOSIS — Z780 Asymptomatic menopausal state: Secondary | ICD-10-CM | POA: Diagnosis not present

## 2022-08-30 DIAGNOSIS — D0511 Intraductal carcinoma in situ of right breast: Secondary | ICD-10-CM | POA: Diagnosis not present

## 2022-08-30 DIAGNOSIS — Z170 Estrogen receptor positive status [ER+]: Secondary | ICD-10-CM | POA: Diagnosis not present

## 2022-08-30 NOTE — Progress Notes (Signed)
New Breast Cancer Diagnosis:Right Breast UOQ  Did patient present with symptoms (if so, please note symptoms) or screening mammography?:Screening Calcifications    Location and Extent of disease :right breast. Located in the upper outer quadrant, measured 3.9 cm in greatest dimension. Adenopathy no.   Histology per Pathology Report: grade Intermediate, DCIS with calcifications and necrosis 08/11/2022  Receptor Status: ER(positive), PR (positive), Her2-neu (), Ki-(%)   Surgeon and surgical plan, if any:  Dr. Brantley Stage  -Right Breast Lumpectomy with radioactive seed localization 09/21/2022   Medical oncologist, treatment if any:   Dr. Chryl Heck 08/30/2022 Recommendation: 1. Breast conserving surgery 2. Followed by adjuvant radiation therapy 3. Followed by antiestrogen therapy with tamoxifen/aromatase inhibitors based on menopausal status 5 years  Family History of Breast/Ovarian/Prostate Cancer: Sister had breast cancer, brother had prostate.  Lymphedema issues, if any: None     Pain issues, if any: Denies pain in the right breast.  She continues to have occasional pains in the left breast.    SAFETY ISSUES: Prior radiation? No Pacemaker/ICD?No  Possible current pregnancy? Hysterectomy Is the patient on methotrexate? No  Current Complaints / other details:

## 2022-08-30 NOTE — Progress Notes (Signed)
Radiation Oncology         [phone] ________________________________  Name: [name]        MRN: [id number]  Date of Service: 08/31/2022 DOB: [birth year]  LO:VFIE, Edwyna Shell, MD (Inactive)  Brantley Stage, Marcello Moores, MD     REFERRING PHYSICIAN: Erroll Luna, MD   DIAGNOSIS: There were no encounter diagnoses.   HISTORY OF PRESENT ILLNESS: [name] is a 62 y.o. female seen at the request of Dr. Brantley Stage for right breast cancer.  The patient was found to have screening detected calcifications in on further diagnostic imaging a span of calcifications in the lower outer quadrant of the right breast were noted measuring up to 3.9 cm.  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  The cancer was ER/PR positive.  She is scheduled to undergo a right breast lumpectomy on 09/21/2022 with Dr. Brantley Stage, and also met with Dr. Chryl Heck yesterday.  She is seen to discuss adjuvant therapy today.    PREVIOUS RADIATION THERAPY: {EXAM; YES/NO:19492::"No"}   PAST MEDICAL HISTORY:  Past Medical History:  Diagnosis Date   ANEMIA-IRON DEFICIENCY 01/15/2008   ARTHRITIS 01/15/2008   BACK PAIN 02/07/2009   CELLULITIS, LEG, LEFT 04/25/2008   GERD 09/24/2010   HEADACHES, HX OF 01/15/2008   HYPERTENSION 01/15/2008   LUMBAR RADICULOPATHY, LEFT 10/20/2010   Prediabetes 11/2016   UTI'S, HX OF 01/15/2008   VITAMIN B12 DEFICIENCY 03/26/2010       PAST SURGICAL HISTORY: Past Surgical History:  Procedure Laterality Date   ABDOMINAL HYSTERECTOMY  1994   fibroids   BILATERAL OOPHORECTOMY     BREAST BIOPSY Right 08/11/2022   MM RT BREAST BX W LOC DEV 1ST LESION IMAGE BX SPEC STEREO GUIDE 08/11/2022 [facility]   CHOLECYSTECTOMY     s/p arthroscopic right knee sugury  10/09   Dr. Eddie Dibbles     FAMILY HISTORY:  Family History  Problem Relation Age of Onset   Arthritis Mother        OA   Diabetes Mother    Hypertension Mother    Heart disease Mother    Hypertension  Father    Stroke Father    Diabetes Father    Arthritis Father        RA   Cancer Other        stomach   Breast cancer Neg Hx      SOCIAL HISTORY:  reports that she has never smoked. She has never used smokeless tobacco. She reports that she does not drink alcohol and does not use drugs.  The patient is married and lives in [place].  She drives a schoolbus for living.***   ALLERGIES: Aspirin   MEDICATIONS:  Current Outpatient Medications  Medication Sig Dispense Refill   allopurinol (ZYLOPRIM) 100 MG tablet Take 1 tablet (100 mg total) by mouth daily. 90 tablet 1   allopurinol (ZYLOPRIM) 100 MG tablet Take 1 tablet (100 mg total) by mouth daily 90 tablet 2   lisinopril (PRINIVIL,ZESTRIL) 20 MG tablet TAKE ONE TABLET BY MOUTH EVERY DAY 90 tablet 2   lisinopril-hydrochlorothiazide (ZESTORETIC) 20-12.5 MG tablet TAKE 1 TABLET BY MOUTH ONCE A DAY 90 tablet 3   lisinopril-hydrochlorothiazide (ZESTORETIC) 20-12.5 MG tablet Take 1 tablet by mouth daily. 90 tablet 3   lisinopril-hydrochlorothiazide (ZESTORETIC) 20-12.5 MG tablet Take 1 tablet by mouth daily. 90 tablet 1   potassium chloride SA (KLOR-CON) 20 MEQ tablet Take 1 tablet (20 mEq total) by mouth daily with food  5 tablet 0   Vitamin D, Ergocalciferol, (DRISDOL) 1.25 MG (50000 UNIT) CAPS capsule Take 1 capsule (50,000 Units total) by mouth 2 (two) times a week. 30 capsule 3   Vitamin D, Ergocalciferol, (DRISDOL) 1.25 MG (50000 UNIT) CAPS capsule Take 1 capsule (50,000 Units total) by mouth once a week 12 capsule 1   No current facility-administered medications for this visit.     REVIEW OF SYSTEMS: On review of systems, the patient reports that she is doing ***     PHYSICAL EXAM:  Wt Readings from Last 3 Encounters:  02/10/18 203 lb 9.6 oz (92.4 kg)  02/04/17 205 lb (93 kg)  08/12/16 195 lb (88.5 kg)   Temp Readings from Last 3 Encounters:  02/10/18 98.9 F (37.2 C)  04/09/11 97 F (36.1 C) (Temporal)  12/24/10  98.5 F (36.9 C) (Oral)   BP Readings from Last 3 Encounters:  02/10/18 124/82  08/12/16 (!) 160/87  04/09/11 120/81   Pulse Readings from Last 3 Encounters:  02/10/18 67  08/12/16 78  12/24/10 69    In general this is a well appearing *** female in no acute distress. She's alert and oriented x4 and appropriate throughout the examination. Cardiopulmonary assessment is negative for acute distress and she exhibits normal effort. Bilateral breast exam is deferred.    ECOG = ***  0 - Asymptomatic (Fully active, able to carry on all predisease activities without restriction)  1 - Symptomatic but completely ambulatory (Restricted in physically strenuous activity but ambulatory and able to carry out work of a light or sedentary nature. For example, light housework, office work)  2 - Symptomatic, <50% in bed during the day (Ambulatory and capable of all self care but unable to carry out any work activities. Up and about more than 50% of waking hours)  3 - Symptomatic, >50% in bed, but not bedbound (Capable of only limited self-care, confined to bed or chair 50% or more of waking hours)  4 - Bedbound (Completely disabled. Cannot carry on any self-care. Totally confined to bed or chair)  5 - Death   Eustace Pen MM, Creech RH, Tormey DC, et al. [phone]). "Toxicity and response criteria of the [facility]". [place] Oncol. 5 (6): 649-55    LABORATORY DATA:  Lab Results  Component Value Date   WBC 6.4 03/24/2011   HGB 13.5 03/24/2011   HCT 41.2 03/24/2011   MCV 73.4 (L) 03/24/2011   PLT 361 03/24/2011   Lab Results  Component Value Date   NA 134 (L) 03/24/2011   K 3.9 03/24/2011   CL 96 03/24/2011   CO2 24 03/24/2011   Lab Results  Component Value Date   ALT 31 03/24/2011   AST 26 03/24/2011   ALKPHOS 68 03/24/2011   BILITOT 0.8 03/24/2011      RADIOGRAPHY: MM RT BREAST BX W LOC DEV 1ST LESION IMAGE BX SPEC STEREO GUIDE  Addendum Date: 08/18/2022    ADDENDUM REPORT: 08/18/2022 11:13 ADDENDUM: Pathology revealed DUCTAL CARCINOMA IN SITU, CRIBRIFORM, INTERMEDIATE NUCLEAR GRADE, NECROSIS: PRESENT, CALCIFICATIONS: PRESENT of the RIGHT breast, upper outer (coil clip). This was found to be concordant by Dr. Lovey Newcomer. Pathology results were discussed with the patient by telephone. The patient reported doing well after the biopsy with tenderness at the site. Post biopsy instructions and care were reviewed and questions were answered. The patient was encouraged to call [facility] for any additional concerns. Surgical consultation has been arranged  with Dr. Erroll Luna at [facility] on August 23, 2022. Pathology results reported by Stacie Acres RN on 08/12/2022. Electronically Signed   By: Lovey Newcomer M.D.   On: 08/18/2022 11:13   Result Date: 08/18/2022 CLINICAL DATA:  Indeterminate right breast calcifications EXAM: RIGHT BREAST STEREOTACTIC CORE NEEDLE BIOPSY COMPARISON:  Previous exam(s). FINDINGS: The patient and I discussed the procedure of stereotactic-guided biopsy including benefits and alternatives. We discussed the high likelihood of a successful procedure. We discussed the risks of the procedure including infection, bleeding, tissue injury, clip migration, and inadequate sampling. Informed written consent was given. The usual time out protocol was performed immediately prior to the procedure. Using sterile technique and 1% Lidocaine as local anesthetic, under stereotactic guidance, a 9 gauge vacuum assisted device was used to perform core needle biopsy of calcifications upper-outer right breast anterior depth using a cranial approach. Specimen radiograph was performed showing calcifications. Specimens with calcifications are identified for pathology. Lesion quadrant: Upper outer quadrant At the conclusion of the procedure, coil shaped tissue marker clip was deployed into the biopsy cavity. Follow-up 2-view  mammogram was performed and dictated separately. IMPRESSION: Stereotactic-guided biopsy of right breast calcifications. No apparent complications. Electronically Signed: By: Lovey Newcomer M.D. On: 08/11/2022 10:06  MM CLIP PLACEMENT RIGHT  Result Date: 08/11/2022 CLINICAL DATA:  Status post stereo biopsy right breast calcifications EXAM: 3D DIAGNOSTIC RIGHT MAMMOGRAM POST STEREOTACTIC BIOPSY COMPARISON:  Previous exam(s). FINDINGS: 3D Mammographic images were obtained following stereotactic guided biopsy of right breast calcifications. The biopsy marking clip is in expected position at the site of biopsy. IMPRESSION: Appropriate positioning of the coil shaped biopsy marking clip at the site of biopsy in the upper-outer right breast. Final Assessment: Post Procedure Mammograms for Marker Placement Electronically Signed   By: Lovey Newcomer M.D.   On: 08/11/2022 10:07      IMPRESSION/PLAN: 1. Intermediate Grade, ER/PR positive DCIS of the right breast. Dr. Lisbeth Renshaw discusses the pathology findings and reviews the nature of *** breast disease. The consensus from the breast conference includes breast conservation with lumpectomy. He would recommend external radiotherapy to the breast  to reduce risks of local recurrence followed by antiestrogen therapy as recommended by Dr. Chryl Heck. We discussed the risks, benefits, short, and long term effects of radiotherapy, as well as the curative intent, and the patient is interested in proceeding. Dr. Lisbeth Renshaw discusses the delivery and logistics of radiotherapy and anticipates a course of 4 weeks of radiotherapy to the right breast. We will see her back a few weeks after surgery to discuss the simulation process and anticipate we starting radiotherapy about 4-6 weeks after surgery.      In a visit lasting *** minutes, greater than 50% of the time was spent face to face reviewing her case, as well as in preparation of, discussing, and coordinating the patient's care.  The above  documentation reflects my direct findings during this shared patient visit. Please see the separate note by Dr. Lisbeth Renshaw on this date for the remainder of the patient's plan of care.    Carola Rhine, [facility]    **Disclaimer: This note was dictated with voice recognition software. Similar sounding words can inadvertently be transcribed and this note may contain transcription errors which may not have been corrected upon publication of note.**

## 2022-08-30 NOTE — Progress Notes (Signed)
[name]  Patient Care Team: Vernie Shanks, MD (Inactive) as PCP - General (Family Medicine)  CHIEF COMPLAINTS/PURPOSE OF CONSULTATION:  Newly diagnosed breast cancer  HISTORY OF PRESENTING ILLNESS:  [name] 62 y.o. female is here because of recent diagnosis of right breast DCIS  I reviewed her records extensively and collaborated the history with the patient.  SUMMARY OF ONCOLOGIC HISTORY: Oncology History  DCIS (ductal carcinoma in situ)  07/13/2022 Mammogram   Bilateral screening mammogram showed calcifications in the right breast.  Unilateral diagnostic mammogram showed suspicious right breast calcifications.   08/11/2022 Pathology Results   Right breast needle core biopsy showed intermediate grade DCIS, ER/PR +100% and 95%, strong staining intensity   08/30/2022 Initial Diagnosis   DCIS (ductal carcinoma in situ)    [name] is here for an initial visit with her husband.  At baseline she has some hypertension and iron deficiency anemia, otherwise healthy.  She denies any prior abnormal mammograms.  No significant family history of breast cancer.  She took hormone replacement therapy several years ago for about 3 to 4 years.  She has 2 children 45 and 4 years old.  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  She denies any history of breast-feeding.  Rest of the pertinent 10 point ROS reviewed and negative  MEDICAL HISTORY:  Past Medical History:  Diagnosis Date   ANEMIA-IRON DEFICIENCY 01/15/2008   ARTHRITIS 01/15/2008   BACK PAIN 02/07/2009   CELLULITIS, LEG, LEFT 04/25/2008   GERD 09/24/2010   HEADACHES, HX OF 01/15/2008   HYPERTENSION 01/15/2008   LUMBAR RADICULOPATHY, LEFT 10/20/2010   Prediabetes 11/2016   UTI'S, HX OF 01/15/2008   VITAMIN B12 DEFICIENCY 03/26/2010    SURGICAL HISTORY: Past Surgical History:  Procedure Laterality Date   ABDOMINAL HYSTERECTOMY  1994   fibroids   BILATERAL  OOPHORECTOMY     BREAST BIOPSY Right 08/11/2022   MM RT BREAST BX W LOC DEV 1ST LESION IMAGE BX SPEC STEREO GUIDE 08/11/2022 [facility]   CHOLECYSTECTOMY     s/p arthroscopic right knee sugury  10/09   Dr. Eddie Dibbles    SOCIAL HISTORY: Social History   Socioeconomic History   Marital status: Married    Spouse name: Not on file   Number of children: 2   Years of education: Not on file   Highest education level: Not on file  Occupational History   Occupation: DRIVER    Employer: [employer]  Tobacco Use   Smoking status: Never   Smokeless tobacco: Never  Substance and Sexual Activity   Alcohol use: No   Drug use: No   Sexual activity: Not on file  Other Topics Concern   Not on file  Social History Narrative   Not on file   Social Determinants of Health   Financial Resource Strain: Not on file  Food Insecurity: Not on file  Transportation Needs: Not on file  Physical Activity: Not on file  Stress: Not on file  Social Connections: Not on file  Intimate Partner Violence: Not on file    FAMILY HISTORY: Family History  Problem Relation Age of Onset   Arthritis Mother        OA   Diabetes Mother    Hypertension Mother    Heart disease Mother    Hypertension Father    Stroke Father    Diabetes Father    Arthritis Father  RA   Cancer Other        stomach   Breast cancer Neg Hx     ALLERGIES:  is allergic to aspirin.  MEDICATIONS:  Current Outpatient Medications  Medication Sig Dispense Refill   allopurinol (ZYLOPRIM) 100 MG tablet Take 1 tablet (100 mg total) by mouth daily. 90 tablet 1   allopurinol (ZYLOPRIM) 100 MG tablet Take 1 tablet (100 mg total) by mouth daily 90 tablet 2   lisinopril (PRINIVIL,ZESTRIL) 20 MG tablet TAKE ONE TABLET BY MOUTH EVERY DAY 90 tablet 2   lisinopril-hydrochlorothiazide (ZESTORETIC) 20-12.5 MG tablet TAKE 1 TABLET BY MOUTH ONCE A DAY 90 tablet 3   lisinopril-hydrochlorothiazide (ZESTORETIC) 20-12.5 MG  tablet Take 1 tablet by mouth daily. 90 tablet 3   lisinopril-hydrochlorothiazide (ZESTORETIC) 20-12.5 MG tablet Take 1 tablet by mouth daily. 90 tablet 1   potassium chloride SA (KLOR-CON) 20 MEQ tablet Take 1 tablet (20 mEq total) by mouth daily with food 5 tablet 0   Vitamin D, Ergocalciferol, (DRISDOL) 1.25 MG (50000 UNIT) CAPS capsule Take 1 capsule (50,000 Units total) by mouth 2 (two) times a week. 30 capsule 3   Vitamin D, Ergocalciferol, (DRISDOL) 1.25 MG (50000 UNIT) CAPS capsule Take 1 capsule (50,000 Units total) by mouth once a week 12 capsule 1   No current facility-administered medications for this visit.    REVIEW OF SYSTEMS:   Constitutional: Denies fevers, chills or abnormal night sweats Eyes: Denies blurriness of vision, double vision or watery eyes Ears, nose, mouth, throat, and face: Denies mucositis or sore throat Respiratory: Denies cough, dyspnea or wheezes Cardiovascular: Denies palpitation, chest discomfort or lower extremity swelling Gastrointestinal:  Denies nausea, heartburn or change in bowel habits Skin: Denies abnormal skin rashes Lymphatics: Denies new lymphadenopathy or easy bruising Neurological:Denies numbness, tingling or new weaknesses Behavioral/Psych: Mood is stable, no new changes  Breast: Denies any palpable lumps or discharge All other systems were reviewed with the patient and are negative.  PHYSICAL EXAMINATION: ECOG PERFORMANCE STATUS: 0 - Asymptomatic  Vitals:   08/30/22 1045  BP: (!) 142/67  Pulse: 75  Resp: 18  Temp: 99.7 F (37.6 C)  SpO2: 100%   Filed Weights   08/30/22 1045  Weight: 178 lb 14.4 oz (81.1 kg)    GENERAL:alert, no distress and comfortable Chest: Bilateral breast examined.  No palpable masses or regional adenopathy Lower extremities:  no lower extremity edema  LABORATORY DATA:  I have reviewed the data as listed Lab Results  Component Value Date   WBC 6.4 03/24/2011   HGB 13.5 03/24/2011   HCT 41.2  03/24/2011   MCV 73.4 (L) 03/24/2011   PLT 361 03/24/2011   Lab Results  Component Value Date   NA 134 (L) 03/24/2011   K 3.9 03/24/2011   CL 96 03/24/2011   CO2 24 03/24/2011    RADIOGRAPHIC STUDIES: I have personally reviewed the radiological reports and agreed with the findings in the report.  ASSESSMENT AND PLAN:  DCIS (ductal carcinoma in situ) This is a very pleasant 62 year old postmenopausal female patient with newly diagnosed right breast DCIS referred to medical oncology for recommendations.  We discussed the following details.  Pathology review: I discussed with the patient the difference between DCIS and invasive breast cancer. It is considered a precancerous lesion. DCIS is classified as a Stage 0 breast cancer. It is generally detected through mammograms as calcifications. We discussed the significance of grades and its impact on prognosis. We also discussed the importance of  ER and PR receptors and their implications to adjuvant treatment options. Prognosis of DCIS dependence on grade and degree of comedo necrosis. It is anticipated that if not treated, 20-30% of DCIS can develop into invasive breast cancer.  Recommendation: 1. Breast conserving surgery 2. Followed by adjuvant radiation therapy 3. Followed by antiestrogen therapy with tamoxifen/aromatase inhibitors based on menopausal status 5 years  Tamoxifen counseling: We discussed the risks and benefits of tamoxifen. These include but not limited to insomnia, hot flashes, mood changes, vaginal dryness, and weight gain. Although rare, serious side effects including endometrial cancer, risk of blood clots were also discussed. We strongly believe that the benefits far outweigh the risks. Patient understands these risks and consented to starting treatment. Planned treatment duration is 5 years.  Aromatase inhibitors counseling: We have discussed the mechanism of action of aromatase inhibitors today.  We have discussed  adverse effects including but not limited to menopausal symptoms, increased risk of osteoporosis and fractures, cardiovascular events, arthralgias and myalgias.  We do believe that the benefits far outweigh the risks.  Plan treatment duration of 5 years.  At this she appears to be leaning towards tamoxifen since it has benefit on bone density.  Will once again discussed options when she returns for follow-up.  We discussed that she can choose to do genetic testing although she may not qualify at this time.  I have also discussed about the specimen collection study for research, she was not interested at this time.  Return to clinic end of March for follow-up  Total time spent: 45 minutes including history, physical exam, review of records, counseling and coordination of care All questions were answered. The patient knows to call the clinic with any problems, questions or concerns.    Benay Pike, MD 08/30/22

## 2022-08-30 NOTE — Assessment & Plan Note (Signed)
This is a very pleasant 62 year old postmenopausal female patient with newly diagnosed right breast DCIS referred to medical oncology for recommendations.  We discussed the following details.  Pathology review: I discussed with the patient the difference between DCIS and invasive breast cancer. It is considered a precancerous lesion. DCIS is classified as a Stage 0 breast cancer. It is generally detected through mammograms as calcifications. We discussed the significance of grades and its impact on prognosis. We also discussed the importance of ER and PR receptors and their implications to adjuvant treatment options. Prognosis of DCIS dependence on grade and degree of comedo necrosis. It is anticipated that if not treated, 20-30% of DCIS can develop into invasive breast cancer.  Recommendation: 1. Breast conserving surgery 2. Followed by adjuvant radiation therapy 3. Followed by antiestrogen therapy with tamoxifen/aromatase inhibitors based on menopausal status 5 years  Tamoxifen counseling: We discussed the risks and benefits of tamoxifen. These include but not limited to insomnia, hot flashes, mood changes, vaginal dryness, and weight gain. Although rare, serious side effects including endometrial cancer, risk of blood clots were also discussed. We strongly believe that the benefits far outweigh the risks. Patient understands these risks and consented to starting treatment. Planned treatment duration is 5 years.  Aromatase inhibitors counseling: We have discussed the mechanism of action of aromatase inhibitors today.  We have discussed adverse effects including but not limited to menopausal symptoms, increased risk of osteoporosis and fractures, cardiovascular events, arthralgias and myalgias.  We do believe that the benefits far outweigh the risks.  Plan treatment duration of 5 years.  At this she appears to be leaning towards tamoxifen since it has benefit on bone density.  Will once again  discussed options when she returns for follow-up.  We discussed that she can choose to do genetic testing although she may not qualify at this time.  I have also discussed about the specimen collection study for research, she was not interested at this time.  Return to clinic end of March for follow-up

## 2022-08-31 ENCOUNTER — Encounter: Payer: Self-pay | Admitting: Radiation Oncology

## 2022-08-31 ENCOUNTER — Ambulatory Visit
Admission: RE | Admit: 2022-08-31 | Discharge: 2022-08-31 | Disposition: A | Discharge status: Home / Self Care | Payer: Commercial Managed Care - PPO | Source: Ambulatory Visit | Attending: Radiation Oncology | Admitting: Radiation Oncology

## 2022-08-31 VITALS — BP 125/74 | HR 70 | Temp 97.9°F | Resp 20 | Ht 66.0 in | Wt 177.2 lb

## 2022-08-31 DIAGNOSIS — Z79899 Other long term (current) drug therapy: Secondary | ICD-10-CM | POA: Diagnosis not present

## 2022-08-31 DIAGNOSIS — D509 Iron deficiency anemia, unspecified: Secondary | ICD-10-CM | POA: Insufficient documentation

## 2022-08-31 DIAGNOSIS — M129 Arthropathy, unspecified: Secondary | ICD-10-CM | POA: Insufficient documentation

## 2022-08-31 DIAGNOSIS — I10 Essential (primary) hypertension: Secondary | ICD-10-CM | POA: Diagnosis not present

## 2022-08-31 DIAGNOSIS — Z800 Family history of malignant neoplasm of digestive organs: Secondary | ICD-10-CM | POA: Diagnosis not present

## 2022-08-31 DIAGNOSIS — K219 Gastro-esophageal reflux disease without esophagitis: Secondary | ICD-10-CM | POA: Diagnosis not present

## 2022-08-31 DIAGNOSIS — Z8042 Family history of malignant neoplasm of prostate: Secondary | ICD-10-CM | POA: Insufficient documentation

## 2022-08-31 DIAGNOSIS — D0511 Intraductal carcinoma in situ of right breast: Secondary | ICD-10-CM

## 2022-08-31 DIAGNOSIS — Z170 Estrogen receptor positive status [ER+]: Secondary | ICD-10-CM | POA: Insufficient documentation

## 2022-08-31 DIAGNOSIS — Z803 Family history of malignant neoplasm of breast: Secondary | ICD-10-CM | POA: Insufficient documentation

## 2022-08-31 DIAGNOSIS — Z809 Family history of malignant neoplasm, unspecified: Secondary | ICD-10-CM

## 2022-09-01 ENCOUNTER — Encounter: Payer: Self-pay | Admitting: *Deleted

## 2022-09-01 DIAGNOSIS — D0511 Intraductal carcinoma in situ of right breast: Secondary | ICD-10-CM

## 2022-09-09 ENCOUNTER — Inpatient Hospital Stay: Payer: Commercial Managed Care - PPO | Admitting: Licensed Clinical Social Worker

## 2022-09-09 NOTE — Progress Notes (Signed)
[facility] [facility] Work  Initial Assessment   [name] is a 62 y.o. year old female contacted by phone. Clinical Social Work was referred by new patient protocol for assessment of psychosocial needs.   SDOH (Social Determinants of Health) assessments performed: Yes   SDOH Screenings   Transportation Needs: No Transportation Needs (09/09/2022)  Financial Resource Strain: Low Risk  (09/09/2022)  Tobacco Use: Low Risk  (08/31/2022)     Distress Screen completed: No     No data to display            Family/Social Information:  Housing Arrangement: patient lives with spouse. 2 adult children- 1 in [place], 1 in [place] Family members/support persons in your life? Family and Friends Transportation concerns: no  Employment: Working part time for school system.  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 yes Concerns about diagnosis and/or treatment: How I will pay for the services I need Patient reported stressors: Finances Current coping skills/ strengths: Capable of independent living , Motivation for treatment/growth , and Supportive family/friends     SUMMARY: Current SDOH Barriers:  Potential Financial constraints related to loss of income during treatment  Clinical Social Work Clinical Goal(s):  Explore community resource options for unmet needs related to:  Financial Strain   Interventions: Discussed common feeling and emotions when being diagnosed with cancer, and the importance of support during treatment Informed patient of the support team roles and support services at [facility] Provided [place] contact information and encouraged patient to call with  any questions or concerns Provided patient with information about cancer foundations for financial assistance via email per pt request   Follow Up Plan: Patient will review resources sent today. Pt will contact CSW with questions or for assistance applying Patient verbalizes understanding of plan: Yes    Aisa Schoeppner E Corianne Buccellato, LCSW

## 2022-09-13 NOTE — Pre-Procedure Instructions (Signed)
Surgical Instructions    Your procedure is scheduled on Tuesday, February 6th.  Report to [facility] Main Entrance "A" at 5:30 A.M., then check in with the Admitting office.  Call this number if you have problems the morning of surgery:  [phone]  If you have any questions prior to your surgery date call [phone]: Open Monday-Friday 8am-4pm If you experience any cold or flu symptoms such as cough, fever, chills, shortness of breath, etc. between now and your scheduled surgery, please notify us at the above number.     Remember:  Do not eat after midnight the night before your surgery  You may drink clear liquids until 4:30 a.m. the morning of your surgery.   Clear liquids allowed are: Water, Non-Citrus Juices (without pulp), Carbonated Beverages, Clear Tea, Black Coffee Only (NO MILK, CREAM OR POWDERED CREAMER of any kind), and Gatorade.    Take these medicines the morning of surgery with A SIP OF WATER  allopurinol (ZYLOPRIM)   As of today, STOP taking any Aspirin (unless otherwise instructed by your surgeon) Aleve, Naproxen, Ibuprofen, Motrin, Advil, Goody's, BC's, all herbal medications, fish oil, and all vitamins.                     Do NOT Smoke (Tobacco/Vaping) for 24 hours prior to your procedure.  If you use a CPAP at night, you may bring your mask/headgear for your overnight stay.   Contacts, glasses, piercing's, hearing aid's, dentures or partials may not be worn into surgery, please bring cases for these belongings.    For patients admitted to the hospital, discharge time will be determined by your treatment team.   Patients discharged the day of surgery will not be allowed to drive home, and someone needs to stay with them for 24 hours.  SURGICAL WAITING ROOM VISITATION Patients having surgery or a procedure may have no more than 2 support people in the waiting area - these visitors may rotate.   Children under the age of 60 must have an adult with them who is  not the patient. If the patient needs to stay at the hospital during part of their recovery, the visitor guidelines for inpatient rooms apply. Pre-op nurse will coordinate an appropriate time for 1 support person to accompany patient in pre-op.  This support person may not rotate.   Please refer to the [facility] website for the visitor guidelines for Inpatients (after your surgery is over and you are in a regular room).    Special instructions:   - Preparing For Surgery  Before surgery, you can play an important role. Because skin is not sterile, your skin needs to be as free of germs as possible. You can reduce the number of germs on your skin by washing with CHG (chlorahexidine gluconate) Soap before surgery.  CHG is an antiseptic cleaner which kills germs and bonds with the skin to continue killing germs even after washing.    Oral Hygiene is also important to reduce your risk of infection.  Remember - BRUSH YOUR TEETH THE MORNING OF SURGERY WITH YOUR REGULAR TOOTHPASTE  Please do not use if you have an allergy to CHG or antibacterial soaps. If your skin becomes reddened/irritated stop using the CHG.  Do not shave (including legs and underarms) for at least 48 hours prior to first CHG shower. It is OK to shave your face.  Please follow these instructions carefully.   Shower the NIGHT BEFORE SURGERY and the MORNING OF  SURGERY  If you chose to wash your hair, wash your hair first as usual with your normal shampoo.  After you shampoo, rinse your hair and body thoroughly to remove the shampoo.  Use CHG Soap as you would any other liquid soap. You can apply CHG directly to the skin and wash gently with a scrungie or a clean washcloth.   Apply the CHG Soap to your body ONLY FROM THE NECK DOWN.  Do not use on open wounds or open sores. Avoid contact with your eyes, ears, mouth and genitals (private parts). Wash Face and genitals (private parts)  with your normal soap.   Wash  thoroughly, paying special attention to the area where your surgery will be performed.  Thoroughly rinse your body with warm water from the neck down.  DO NOT shower/wash with your normal soap after using and rinsing off the CHG Soap.  Pat yourself dry with a CLEAN TOWEL.  Wear CLEAN PAJAMAS to bed the night before surgery  Place CLEAN SHEETS on your bed the night before your surgery  DO NOT SLEEP WITH PETS.   Day of Surgery: Take a shower with CHG soap. Do not wear jewelry or makeup Do not wear lotions, powders, perfumes, or deodorant. Do not shave 48 hours prior to surgery. Do not bring valuables to the hospital.  [facility] is not responsible for any belongings or valuables. Do not wear nail polish, gel polish, artificial nails, or any other type of covering on natural nails (fingers and toes) If you have artificial nails or gel coating that need to be removed by a nail salon, please have this removed prior to surgery. Artificial nails or gel coating may interfere with anesthesia's ability to adequately monitor your vital signs. Wear Clean/Comfortable clothing the morning of surgery Remember to brush your teeth WITH YOUR REGULAR TOOTHPASTE.   Please read over the following fact sheets that you were given.    If you received a COVID test during your pre-op visit  it is requested that you wear a mask when out in public, stay away from anyone that may not be feeling well and notify your surgeon if you develop symptoms. If you have been in contact with anyone that has tested positive in the last 10 days please notify you surgeon.

## 2022-09-14 ENCOUNTER — Encounter (HOSPITAL_COMMUNITY): Payer: Self-pay

## 2022-09-14 ENCOUNTER — Other Ambulatory Visit: Payer: Self-pay

## 2022-09-14 ENCOUNTER — Encounter (HOSPITAL_COMMUNITY)
Admission: RE | Admit: 2022-09-14 | Discharge: 2022-09-14 | Disposition: A | Discharge status: Home / Self Care | Payer: Commercial Managed Care - PPO | Source: Ambulatory Visit | Attending: Surgery | Admitting: Surgery

## 2022-09-14 VITALS — BP 138/71 | HR 65 | Temp 97.6°F | Resp 18 | Ht 66.0 in | Wt 178.3 lb

## 2022-09-14 DIAGNOSIS — Z01818 Encounter for other preprocedural examination: Secondary | ICD-10-CM | POA: Diagnosis not present

## 2022-09-14 DIAGNOSIS — I10 Essential (primary) hypertension: Secondary | ICD-10-CM | POA: Insufficient documentation

## 2022-09-14 LAB — CBC
HCT: 36.4 % (ref 36.0–46.0)
Hemoglobin: 11.8 g/dL — ABNORMAL LOW (ref 12.0–15.0)
MCH: 24.4 pg — ABNORMAL LOW (ref 26.0–34.0)
MCHC: 32.4 g/dL (ref 30.0–36.0)
MCV: 75.2 fL — ABNORMAL LOW (ref 80.0–100.0)
Platelets: 307 10*3/uL (ref 150–400)
RBC: 4.84 MIL/uL (ref 3.87–5.11)
RDW: 15.2 % (ref 11.5–15.5)
WBC: 4.4 10*3/uL (ref 4.0–10.5)
nRBC: 0 % (ref 0.0–0.2)

## 2022-09-14 LAB — BASIC METABOLIC PANEL
Anion gap: 9 (ref 5–15)
BUN: 21 mg/dL (ref 8–23)
CO2: 26 mmol/L (ref 22–32)
Calcium: 9.5 mg/dL (ref 8.9–10.3)
Chloride: 105 mmol/L (ref 98–111)
Creatinine, Ser: 0.96 mg/dL (ref 0.44–1.00)
GFR, Estimated: >60 mL/min (ref >60)
Glucose, Bld: 87 mg/dL (ref 70–99)
Potassium: 3.6 mmol/L (ref 3.5–5.1)
Sodium: 140 mmol/L (ref 135–145)

## 2022-09-14 NOTE — Progress Notes (Addendum)
PCP - Yaakov Guthrie now seeing Dr. Thalia Party Cardiologist - None  PPM/ICD - Denies Device Orders -  Rep Notified -   Chest x-ray - NI EKG - 09/14/22 Stress Test - Denies ECHO - Denies Cardiac Cath - Denies  Sleep Study - No CPAP -   DM - Denies Fasting Blood Sugar -  Checks Blood Sugar _____ times a day  Last dose of GLP1 agonist-  Denies GLP1 instructions:   Blood Thinner Instructions:Denies Aspirin Instructions:Denies  ERAS Protcol -Yes    COVID TEST- NI   Anesthesia review:Yes abnormal EKG   Patient denies shortness of breath, fever, cough and chest pain at PAT appointment   All instructions explained to the patient, with a verbal understanding of the material. Patient agrees to go over the instructions while at home for a better understanding.  The opportunity to ask questions was provided.

## 2022-09-20 ENCOUNTER — Ambulatory Visit
Admission: RE | Admit: 2022-09-20 | Discharge: 2022-09-20 | Disposition: A | Discharge status: Home / Self Care | Payer: Commercial Managed Care - PPO | Source: Ambulatory Visit | Attending: Surgery | Admitting: Surgery

## 2022-09-20 DIAGNOSIS — D0511 Intraductal carcinoma in situ of right breast: Secondary | ICD-10-CM | POA: Diagnosis not present

## 2022-09-20 HISTORY — PX: BREAST BIOPSY: SHX20

## 2022-09-20 NOTE — Anesthesia Preprocedure Evaluation (Signed)
Anesthesia Evaluation  Patient identified by MRN, date of birth, ID band Patient awake    Reviewed: Allergy & Precautions, NPO status , Patient's Chart, lab work & pertinent test results, reviewed documented beta blocker date and time   Airway Mallampati: II  TM Distance: >3 FB Neck ROM: Full    Dental  (+) Partial Lower, Dental Advisory Given   Pulmonary neg pulmonary ROS   Pulmonary exam normal breath sounds clear to auscultation       Cardiovascular hypertension, Pt. on medications Normal cardiovascular exam Rhythm:Regular Rate:Normal     Neuro/Psych  Neuromuscular disease  negative psych ROS   GI/Hepatic Neg liver ROS,GERD  ,,  Endo/Other  Gout DCIS right breast  Renal/GU negative Renal ROS  negative genitourinary   Musculoskeletal  (+) Arthritis ,    Abdominal   Peds  Hematology  (+) Blood dyscrasia, anemia   Anesthesia Other Findings   Reproductive/Obstetrics                              Anesthesia Physical Anesthesia Plan  ASA: 2  Anesthesia Plan: General   Post-op Pain Management: Minimal or no pain anticipated, Dilaudid IV, Tylenol PO (pre-op)* and Precedex   Induction: Intravenous  PONV Risk Score and Plan: 4 or greater and Treatment may vary due to age or medical condition, Midazolam, Dexamethasone and Ondansetron  Airway Management Planned: LMA  Additional Equipment: None  Intra-op Plan:   Post-operative Plan: Extubation in OR  Informed Consent: I have reviewed the patients History and Physical, chart, labs and discussed the procedure including the risks, benefits and alternatives for the proposed anesthesia with the patient or authorized representative who has indicated his/her understanding and acceptance.     Dental advisory given  Plan Discussed with: CRNA and Anesthesiologist  Anesthesia Plan Comments:         Anesthesia Quick Evaluation

## 2022-09-21 ENCOUNTER — Ambulatory Visit
Admission: RE | Admit: 2022-09-21 | Discharge: 2022-09-21 | Disposition: A | Discharge status: Home / Self Care | Payer: Commercial Managed Care - PPO | Source: Ambulatory Visit | Attending: Surgery | Admitting: Surgery

## 2022-09-21 ENCOUNTER — Other Ambulatory Visit: Payer: Self-pay

## 2022-09-21 ENCOUNTER — Encounter (HOSPITAL_COMMUNITY)
Admission: RE | Disposition: A | Discharge status: Home / Self Care | Payer: Self-pay | Source: Home / Self Care | Attending: Surgery

## 2022-09-21 ENCOUNTER — Ambulatory Visit (HOSPITAL_COMMUNITY): Payer: Commercial Managed Care - PPO | Admitting: Physician Assistant

## 2022-09-21 ENCOUNTER — Other Ambulatory Visit (HOSPITAL_COMMUNITY): Payer: Self-pay

## 2022-09-21 ENCOUNTER — Ambulatory Visit (HOSPITAL_COMMUNITY)
Admission: RE | Admit: 2022-09-21 | Discharge: 2022-09-21 | Disposition: A | Discharge status: Home / Self Care | Payer: Commercial Managed Care - PPO | Attending: Surgery | Admitting: Surgery

## 2022-09-21 ENCOUNTER — Ambulatory Visit (HOSPITAL_BASED_OUTPATIENT_CLINIC_OR_DEPARTMENT_OTHER): Payer: Commercial Managed Care - PPO | Admitting: Physician Assistant

## 2022-09-21 ENCOUNTER — Encounter (HOSPITAL_COMMUNITY): Payer: Self-pay | Admitting: Surgery

## 2022-09-21 DIAGNOSIS — N6011 Diffuse cystic mastopathy of right breast: Secondary | ICD-10-CM | POA: Insufficient documentation

## 2022-09-21 DIAGNOSIS — M109 Gout, unspecified: Secondary | ICD-10-CM | POA: Diagnosis not present

## 2022-09-21 DIAGNOSIS — D630 Anemia in neoplastic disease: Secondary | ICD-10-CM | POA: Diagnosis not present

## 2022-09-21 DIAGNOSIS — D0511 Intraductal carcinoma in situ of right breast: Secondary | ICD-10-CM

## 2022-09-21 DIAGNOSIS — I10 Essential (primary) hypertension: Secondary | ICD-10-CM | POA: Diagnosis not present

## 2022-09-21 DIAGNOSIS — R928 Other abnormal and inconclusive findings on diagnostic imaging of breast: Secondary | ICD-10-CM | POA: Diagnosis not present

## 2022-09-21 HISTORY — PX: BREAST LUMPECTOMY WITH RADIOACTIVE SEED LOCALIZATION: SHX6424

## 2022-09-21 SURGERY — BREAST LUMPECTOMY WITH RADIOACTIVE SEED LOCALIZATION
Anesthesia: General | Site: Breast | Laterality: Right

## 2022-09-21 MED ORDER — CEFAZOLIN IN SODIUM CHLORIDE 3-0.9 GM/100ML-% IV SOLN: 3.0000 g | INTRAVENOUS | Status: DC

## 2022-09-21 MED ORDER — ORAL CARE MOUTH RINSE: 15.0000 mL | Freq: Once | OROMUCOSAL | Status: AC

## 2022-09-21 MED ORDER — CHLORHEXIDINE GLUCONATE 0.12 % MT SOLN: 15.0000 mL | Freq: Once | OROMUCOSAL | Status: AC

## 2022-09-21 MED ORDER — CHLORHEXIDINE GLUCONATE 0.12 % MT SOLN
OROMUCOSAL | Status: AC
  Administered 2022-09-21: 15 mL
  Filled 2022-09-21: qty 15

## 2022-09-21 MED ORDER — CHLORHEXIDINE GLUCONATE CLOTH 2 % EX PADS: 6.0000 | MEDICATED_PAD | Freq: Once | CUTANEOUS | Status: DC

## 2022-09-21 MED ORDER — OXYCODONE HCL 5 MG PO TABS
5.0000 mg | ORAL_TABLET | Freq: Four times a day (QID) | ORAL | 0 refills | Status: DC | PRN
  Filled 2022-09-21: qty 15, 4d supply, fill #0

## 2022-09-21 MED ORDER — DEXAMETHASONE SODIUM PHOSPHATE 10 MG/ML IJ SOLN
INTRAMUSCULAR | Status: DC | PRN
  Administered 2022-09-21: 10 mg via INTRAVENOUS

## 2022-09-21 MED ORDER — DEXAMETHASONE SODIUM PHOSPHATE 10 MG/ML IJ SOLN
INTRAMUSCULAR | Status: AC
  Filled 2022-09-21: qty 1

## 2022-09-21 MED ORDER — LIDOCAINE 2% (20 MG/ML) 5 ML SYRINGE
INTRAMUSCULAR | Status: AC
  Filled 2022-09-21: qty 5

## 2022-09-21 MED ORDER — LACTATED RINGERS IV SOLN: INTRAVENOUS | Status: DC

## 2022-09-21 MED ORDER — CEFAZOLIN IN SODIUM CHLORIDE 3-0.9 GM/100ML-% IV SOLN
INTRAVENOUS | Status: AC
  Filled 2022-09-21: qty 100

## 2022-09-21 MED ORDER — PHENYLEPHRINE 80 MCG/ML (10ML) SYRINGE FOR IV PUSH (FOR BLOOD PRESSURE SUPPORT)
PREFILLED_SYRINGE | INTRAVENOUS | Status: DC | PRN
  Administered 2022-09-21 (×2): 80 ug via INTRAVENOUS

## 2022-09-21 MED ORDER — ONDANSETRON HCL 4 MG/2ML IJ SOLN
INTRAMUSCULAR | Status: DC | PRN
  Administered 2022-09-21: 4 mg via INTRAVENOUS

## 2022-09-21 MED ORDER — LIDOCAINE 2% (20 MG/ML) 5 ML SYRINGE
INTRAMUSCULAR | Status: DC | PRN
  Administered 2022-09-21: 60 mg via INTRAVENOUS

## 2022-09-21 MED ORDER — CEFAZOLIN SODIUM-DEXTROSE 2-3 GM-%(50ML) IV SOLR
INTRAVENOUS | Status: DC | PRN
  Administered 2022-09-21: 2 g via INTRAVENOUS

## 2022-09-21 MED ORDER — ACETAMINOPHEN 500 MG PO TABS
1000.0000 mg | ORAL_TABLET | ORAL | Status: AC
  Administered 2022-09-21: 1000 mg via ORAL

## 2022-09-21 MED ORDER — BUPIVACAINE-EPINEPHRINE (PF) 0.5% -1:200000 IJ SOLN
INTRAMUSCULAR | Status: DC | PRN
  Administered 2022-09-21: 30 mL

## 2022-09-21 MED ORDER — PHENYLEPHRINE 80 MCG/ML (10ML) SYRINGE FOR IV PUSH (FOR BLOOD PRESSURE SUPPORT)
PREFILLED_SYRINGE | INTRAVENOUS | Status: AC
  Filled 2022-09-21: qty 10

## 2022-09-21 MED ORDER — PROPOFOL 10 MG/ML IV BOLUS
INTRAVENOUS | Status: AC
  Filled 2022-09-21: qty 20

## 2022-09-21 MED ORDER — ACETAMINOPHEN 500 MG PO TABS
ORAL_TABLET | ORAL | Status: AC
  Filled 2022-09-21: qty 2

## 2022-09-21 MED ORDER — ONDANSETRON HCL 4 MG/2ML IJ SOLN
INTRAMUSCULAR | Status: AC
  Filled 2022-09-21: qty 2

## 2022-09-21 MED ORDER — PROPOFOL 10 MG/ML IV BOLUS
INTRAVENOUS | Status: DC | PRN
  Administered 2022-09-21: 50 mg via INTRAVENOUS
  Administered 2022-09-21: 150 mg via INTRAVENOUS

## 2022-09-21 MED ORDER — FENTANYL CITRATE (PF) 250 MCG/5ML IJ SOLN
INTRAMUSCULAR | Status: AC
  Filled 2022-09-21: qty 5

## 2022-09-21 MED ORDER — MIDAZOLAM HCL 2 MG/2ML IJ SOLN
INTRAMUSCULAR | Status: DC | PRN
  Administered 2022-09-21: 1 mg via INTRAVENOUS

## 2022-09-21 MED ORDER — MIDAZOLAM HCL 2 MG/2ML IJ SOLN
INTRAMUSCULAR | Status: AC
  Filled 2022-09-21: qty 2

## 2022-09-21 MED ORDER — FENTANYL CITRATE (PF) 250 MCG/5ML IJ SOLN
INTRAMUSCULAR | Status: DC | PRN
  Administered 2022-09-21 (×2): 25 ug via INTRAVENOUS

## 2022-09-21 MED ORDER — BUPIVACAINE-EPINEPHRINE (PF) 0.5% -1:200000 IJ SOLN
INTRAMUSCULAR | Status: AC
  Filled 2022-09-21: qty 30

## 2022-09-21 MED ORDER — CEFAZOLIN SODIUM-DEXTROSE 2-4 GM/100ML-% IV SOLN
INTRAVENOUS | Status: AC
  Filled 2022-09-21: qty 100

## 2022-09-21 MED ORDER — 0.9 % SODIUM CHLORIDE (POUR BTL) OPTIME
TOPICAL | Status: DC | PRN
  Administered 2022-09-21: 1000 mL

## 2022-09-21 SURGICAL SUPPLY — 41 items
ADH SKN CLS APL DERMABOND .7 (GAUZE/BANDAGES/DRESSINGS) ×1
APL PRP STRL LF DISP 70% ISPRP (MISCELLANEOUS) ×1
APPLIER CLIP 9.375 MED OPEN (MISCELLANEOUS) ×1
APR CLP MED 9.3 20 MLT OPN (MISCELLANEOUS) ×1
BAG COUNTER SPONGE SURGICOUNT (BAG) ×1 IMPLANT
BAG SPNG CNTER NS LX DISP (BAG) ×1
BINDER BREAST LRG (GAUZE/BANDAGES/DRESSINGS) IMPLANT
BINDER BREAST XLRG (GAUZE/BANDAGES/DRESSINGS) IMPLANT
CANISTER SUCT 3000ML PPV (MISCELLANEOUS) IMPLANT
CHLORAPREP W/TINT 26 (MISCELLANEOUS) ×1 IMPLANT
CLIP APPLIE 9.375 MED OPEN (MISCELLANEOUS) IMPLANT
COVER PROBE W GEL 5X96 (DRAPES) ×1 IMPLANT
COVER SURGICAL LIGHT HANDLE (MISCELLANEOUS) ×1 IMPLANT
DERMABOND ADVANCED .7 DNX12 (GAUZE/BANDAGES/DRESSINGS) ×1 IMPLANT
DEVICE DUBIN SPECIMEN MAMMOGRA (MISCELLANEOUS) ×1 IMPLANT
DRAPE CHEST BREAST 15X10 FENES (DRAPES) ×1 IMPLANT
ELECT CAUTERY BLADE 6.4 (BLADE) ×1 IMPLANT
ELECT REM PT RETURN 9FT ADLT (ELECTROSURGICAL) ×1
ELECTRODE REM PT RTRN 9FT ADLT (ELECTROSURGICAL) ×1 IMPLANT
GLOVE BIO SURGEON STRL SZ8 (GLOVE) ×1 IMPLANT
GLOVE BIOGEL PI IND STRL 8 (GLOVE) ×1 IMPLANT
GOWN STRL REUS W/ TWL LRG LVL3 (GOWN DISPOSABLE) ×1 IMPLANT
GOWN STRL REUS W/ TWL XL LVL3 (GOWN DISPOSABLE) ×1 IMPLANT
GOWN STRL REUS W/TWL LRG LVL3 (GOWN DISPOSABLE) ×1
GOWN STRL REUS W/TWL XL LVL3 (GOWN DISPOSABLE) ×1
KIT BASIN OR (CUSTOM PROCEDURE TRAY) ×1 IMPLANT
KIT MARKER MARGIN INK (KITS) IMPLANT
LIGHT WAVEGUIDE WIDE FLAT (MISCELLANEOUS) IMPLANT
NDL HYPO 25GX1X1/2 BEV (NEEDLE) IMPLANT
NEEDLE HYPO 25GX1X1/2 BEV (NEEDLE) IMPLANT
NS IRRIG 1000ML POUR BTL (IV SOLUTION) IMPLANT
PACK GENERAL/GYN (CUSTOM PROCEDURE TRAY) ×1 IMPLANT
SUT MNCRL AB 4-0 PS2 18 (SUTURE) ×1 IMPLANT
SUT SILK 2 0 SH (SUTURE) IMPLANT
SUT VIC AB 2-0 SH 27 (SUTURE)
SUT VIC AB 2-0 SH 27XBRD (SUTURE) IMPLANT
SUT VIC AB 3-0 SH 27 (SUTURE)
SUT VIC AB 3-0 SH 27X BRD (SUTURE) IMPLANT
SUT VIC AB 3-0 SH 8-18 (SUTURE) ×1 IMPLANT
SYR CONTROL 10ML LL (SYRINGE) IMPLANT
TOWEL GREEN STERILE FF (TOWEL DISPOSABLE) IMPLANT

## 2022-09-21 NOTE — Discharge Instructions (Signed)
[facility] Office Phone Number [phone]  BREAST BIOPSY/ PARTIAL MASTECTOMY: POST OP INSTRUCTIONS  Always review your discharge instruction sheet given to you by the facility where your surgery was performed.  IF YOU HAVE DISABILITY OR FAMILY LEAVE FORMS, YOU MUST BRING THEM TO THE OFFICE FOR PROCESSING.  DO NOT GIVE THEM TO YOUR DOCTOR.  A prescription for pain medication may be given to you upon discharge.  Take your pain medication as prescribed, if needed.  If narcotic pain medicine is not needed, then you may take acetaminophen (Tylenol) or ibuprofen (Advil) as needed. Take your usually prescribed medications unless otherwise directed If you need a refill on your pain medication, please contact your pharmacy.  They will contact our office to request authorization.  Prescriptions will not be filled after 5pm or on week-ends. You should eat very light the first 24 hours after surgery, such as soup, crackers, pudding, etc.  Resume your normal diet the day after surgery. Most patients will experience some swelling and bruising in the breast.  Ice packs and a good support bra will help.  Swelling and bruising can take several days to resolve.  It is common to experience some constipation if taking pain medication after surgery.  Increasing fluid intake and taking a stool softener will usually help or prevent this problem from occurring.  A mild laxative (Milk of Magnesia or Miralax) should be taken according to package directions if there are no bowel movements after 48 hours. Unless discharge instructions indicate otherwise, you may remove your bandages 24-48 hours after surgery, and you may shower at that time.  You may have steri-strips (small skin tapes) in place directly over the incision.  These strips should be left on the skin for 7-10 days.  If your surgeon used skin glue on the incision, you may shower in 24 hours.  The glue will flake off over the next 2-3 weeks.  Any  sutures or staples will be removed at the office during your follow-up visit. ACTIVITIES:  You may resume regular daily activities (gradually increasing) beginning the next day.  Wearing a good support bra or sports bra minimizes pain and swelling.  You may have sexual intercourse when it is comfortable. You may drive when you no longer are taking prescription pain medication, you can comfortably wear a seatbelt, and you can safely maneuver your car and apply brakes. RETURN TO WORK:  ______________________________________________________________________________________ You should see your doctor in the office for a follow-up appointment approximately two weeks after your surgery.  Your doctor's nurse will typically make your follow-up appointment when she calls you with your pathology report.  Expect your pathology report 2-3 business days after your surgery.  You may call to check if you do not hear from us after three days. OTHER INSTRUCTIONS: _______________________________________________________________________________________________ _____________________________________________________________________________________________________________________________________ _____________________________________________________________________________________________________________________________________ _____________________________________________________________________________________________________________________________________  WHEN TO CALL YOUR DOCTOR: Fever over 101.0 Nausea and/or vomiting. Extreme swelling or bruising. Continued bleeding from incision. Increased pain, redness, or drainage from the incision.  The clinic staff is available to answer your questions during regular business hours.  Please don't hesitate to call and ask to speak to one of the nurses for clinical concerns.  If you have a medical emergency, go to the nearest emergency room or call 911.  A surgeon from [facility] is always on call at the hospital.  For further questions, please visit [email or web address]   

## 2022-09-21 NOTE — H&P (Signed)
History of Present Illness: Autumn [name] is [name] 62 y.o. female who is seen today as an office consultation for evaluation of New Consultation and Breast Cancer .   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 Location central to lower outer quadrant. Area measures 3.9 cm which were microcalcifications. 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 Intermediate to high-grade. ER and PR positive. No family history of breast cancer or other cancers that she is aware of. This is her first biopsy. Denies any history of breast pain, breast mass or nipple discharge.  Review of Systems: [name] complete review of systems was obtained from the patient. I have reviewed this information and discussed as appropriate with the patient. See HPI as well for other ROS.    Medical History: Past Medical History:  Diagnosis Date  Hypertension   There is no problem list on file for this patient.  Past Surgical History:  Procedure Laterality Date  HYSTERECTOMY  LAPAROSCOPIC CHOLECYSTECTOMY    Allergies  Allergen Reactions  Asprin Ec Low Dose [Aspirin] Abdominal Pain   Current Outpatient Medications on File Prior to Visit  Medication Sig Dispense Refill  allopurinoL (ZYLOPRIM) 100 MG tablet Take 100 mg by mouth once daily  ergocalciferol, vitamin D2, 1,250 mcg (50,000 unit) capsule Take 50,000 Units by mouth every 7 (seven) days  lisinopriL-hydroCHLOROthiazide (ZESTORETIC) 20-12.5 mg tablet Take 1 tablet by mouth once daily   No current facility-administered medications on file prior to visit.   Family History  Problem Relation Age of Onset  High blood pressure (Hypertension) Mother  High blood pressure (Hypertension) Sister    Social History   Tobacco Use  Smoking Status Never  Smokeless Tobacco Never    Social History   Socioeconomic History  Marital status: Married  Tobacco Use   Smoking status: Never  Smokeless tobacco: Never  Substance and Sexual Activity  Alcohol use: Not Currently  Drug use: Never   Objective:   Vitals:  08/23/22 0931  BP: 135/83  Pulse: 84  Temp: (!) 38.1 C (100.5 F)  SpO2: 97%  Weight: 82.1 kg (181 lb)  Height: 152.4 cm (5')   Body mass index is 35.35 kg/m.  Physical Exam Exam conducted with [name] chaperone present.  Constitutional:  Appearance: Normal appearance.  HENT:  Head: Normocephalic.  Eyes:  Pupils: Pupils are equal, round, and reactive to light.  Cardiovascular:  Rate and Rhythm: Normal rate.  Pulmonary:  Effort: Pulmonary effort is normal.  Chest:  Breasts: Right: Normal.  Left: Normal.  Musculoskeletal:  General: Normal range of motion.  Cervical back: Normal range of motion.  Lymphadenopathy:  Upper Body:  Right upper body: No supraclavicular or axillary adenopathy.  Left upper body: No supraclavicular or axillary adenopathy.  Skin: General: Skin is warm.  Neurological:  General: No focal deficit present.  Mental Status: She is alert.  Psychiatric:  Mood and Affect: Mood normal.     Labs, Imaging and Diagnostic Testing:  CLINICAL DATA: Screening recall for right breast calcifications.  EXAM: DIGITAL DIAGNOSTIC UNILATERAL RIGHT MAMMOGRAM  TECHNIQUE: Right digital diagnostic mammography was performed.  COMPARISON: Previous exam(s).  ACR Breast Density Category b: There are scattered areas of fibroglandular density.  FINDINGS: Segmental group of calcifications, small round and small coarse morphology, extend from the immediate retroareolar right breast posteriorly into the anterior upper outer quadrant, spanning 3.9 cm in long axis. There is no associated mass or distortion. Calcifications have an  apparent ductal arrangement.  IMPRESSION: 1. Suspicious right breast calcifications. Tissue sampling is recommended.  RECOMMENDATION: 1. Stereotactic core needle biopsy of right breast  calcifications. This procedure was scheduled prior to the patient being discharged from the [facility].  I have discussed the findings and recommendations with the patient. If applicable, [name] reminder letter will be sent to the patient regarding the next appointment.  BI-RADS CATEGORY 4: Suspicious.   Electronically Signed By: Autumn [name] M.D. On: 07/26/2022 08:56  Diagnosis Breast, right, needle core biopsy, upper outer DUCTAL CARCINOMA IN SITU, CRIBRIFORM, INTERMEDIATE NUCLEAR GRADE NECROSIS: PRESENT CALCIFICATIONS: PRESENT DCIS LENGTH: 0.6 CM SEE NOTE Diagnosis Note Autumn [name] reviewed the case and concurs with the interpretation. [name] breast prognostic profile (ER and PR) is pending and will be reported in an addendum. [place] was notified on 08/12/2022. Autumn Laws MD Pathologist, Electronic Signature (Case signed 08/12/2022) Specimen Gross and Clinical Information Specimen Comment TIF: 9:05 AM, CIT < 5 min; calcs Specimen(s) Obtained: Breast, right, needle core biopsy, upper outer Specimen Clinical Information DCIS vs FCC Gross Received in formalin, time in formalin 9:05 [name].m. and CIT less than 5 minutes, are fragments and cores of soft tan yellow tissue which measure 2 x 2 x 0.6 cm in aggregate. The specimen is entirely submitted in three cassettes with tissue containing calcifications in [name] and B. (GRP:kh 08/11/22) 1 of 2 FINAL for Autumn [name], Autumn [name] ([id number]) Stain(s) used in Diagnosis: The following stain(s) were used in diagnosing the case: PR-ACIS, ER-ACIS. The control(s) stained appropriately. ADDITIONAL INFORMATION: 1A) Breast, right, needle core biopsy, upper outer PROGNOSTIC INDICATORS Results: IMMUNOHISTOCHEMICAL AND MORPHOMETRIC ANALYSIS PERFORMED MANUALLY Estrogen Receptor: 100%, POSITIVE, STRONG STAINING INTENSITY Progesterone Receptor: 95%, POSITIVE, STRONG STAINING INTENSITY REFERENCE RANGE ESTROGEN RECEPTOR NEGATIVE  0% POSITIVE =>1% REFERENCE RANGE PROGESTERONE RECEPTOR NEGATIVE 0% POSITIVE =>1% All controls stained appropriately Autumn Chad MD Pathologist, Electronic Signature ( Signed 08/13/2022)  Assessment and Plan:   Diagnoses and all orders for this visit:  Ductal carcinoma in situ (DCIS) of right breast    Reviewed the pathophysiology of ductal carcinoma in situ of breast cancer. Discussed treatment options from the standpoint of surgical approach. Discussed lumpectomy and breast conserving surgery versus mastectomy and reconstruction. Discussed local regional recurrence rates with age, morbidity, mortality, and local regional recurrence as well as adjuvant therapy with age. She is opted for right breast seed localized lumpectomy since she wishes to pursue breast conserving surgery. We discussed the role of lymph node surgery as well with this disease process. She will follow with medical and radiation oncology and we will get her scheduled for right breast seed localized lumpectomy as an outpatient. We discussed the use of the seed. Risks and benefits of surgery reviewed. Complications reviewed.The procedure has been discussed with the patient. Alternatives to surgery have been discussed with the patient. Risks of surgery include bleeding, Infection, Seroma formation, death, and the need for further surgery. The patient understands and wishes to proceed.   No follow-ups on file.  Kennieth Francois, MD

## 2022-09-21 NOTE — Transfer of Care (Signed)
Immediate Anesthesia Transfer of Care Note  Patient: [name]  Procedure(s) Performed: RIGHT BREAST LUMPECTOMY WITH RADIOACTIVE SEED LOCALIZATION (Right: Breast)  Patient Location: PACU  Anesthesia Type:General  Level of Consciousness: drowsy  Airway & Oxygen Therapy: Patient Spontanous Breathing and Patient connected to nasal cannula oxygen  Post-op Assessment: Report given to RN, Post -op Vital signs reviewed and stable, and Patient moving all extremities X 4  Post vital signs: Reviewed and stable  Last Vitals:  Vitals Value Taken Time  BP 133/72   Temp    Pulse 62   Resp 18   SpO2 99     Last Pain:  Vitals:   09/21/22 0639  TempSrc:   PainSc: 0-No pain         Complications: No notable events documented.

## 2022-09-21 NOTE — Anesthesia Procedure Notes (Signed)
Procedure Name: LMA Insertion Date/Time: 09/21/2022 7:35 AM  Performed by: Maude Leriche, CRNAPre-anesthesia Checklist: Patient identified, Emergency Drugs available, Suction available, Patient being monitored and Timeout performed Patient Re-evaluated:Patient Re-evaluated prior to induction Oxygen Delivery Method: Circle system utilized Preoxygenation: Pre-oxygenation with 100% oxygen Induction Type: IV induction LMA: LMA inserted LMA Size: 4.0 Number of attempts: 1 Placement Confirmation: CO2 detector and breath sounds checked- equal and bilateral Tube secured with: Tape Dental Injury: Teeth and Oropharynx as per pre-operative assessment

## 2022-09-21 NOTE — Op Note (Signed)
Preoperative diagnosis: Right breast DCIS upper outer quadrant  Postoperative diagnosis: Same  Procedure: Right breast seed localized lumpectomy  Surgeon: Marcello Moores Dorsel Flinn  CC: 0.5% Marcaine plain  EBL: Minimal  Specimen: Right breast tissue with seed and clip verified imaging  Drains: None  IV fluids: Per anesthesia record  Indications for procedure: Patient presents for breast conserving surgery.  Diagnosed with right breast DCIS.  She was evaluated in a multidisciplinary clinic setting and opted for breast conserving surgery after reviewing her options.The procedure has been discussed with the patient. Alternatives to surgery have been discussed with the patient.  Risks of surgery include bleeding,  Infection,  Seroma formation, death,  and the need for further surgery.   The patient understands and wishes to proceed.     Description of procedure: The patient was met in the holding area and questions were answered.  The right breast was marked as the correct site.  Of note she underwent seed placement as outpatient.  All questions were answered.  She was taken back to the operating room.  She was placed supine upon the operating room table.  After induction of general anesthesia, the right breast was prepped and draped in sterile fashion timeout performed.  Neoprobe was used to identify the seed in the right breast just adjacent to the areola complex.  Curvilinear incision was made along the superior border of this.  Dissection was carried down all tissue and the seed and clip were excised with a grossly negative margin.  Wide excision was performed after discussion of this with the radiologist.  We had about 3 cm overlap with directions.  Radiograph revealed the seed and clip to be present.  The anterior margin was the nipple itself.  Irrigation was used.  Hemostasis achieved and the cavity was infiltrated with 0.5% Marcaine.  Clips were placed to mark the cavity.  Vicryl was used to close  the deep layer.  4 Monocryl was used to close the skin in a subcuticular fashion.  Dermabond applied.  All counts found to be correct.  Breast binder placed.  The patient was awoke extubated taken recovery in satisfactory condition.

## 2022-09-21 NOTE — Anesthesia Postprocedure Evaluation (Signed)
Anesthesia Post Note  Patient: Dezaray Shibuya Rominger  Procedure(s) Performed: RIGHT BREAST LUMPECTOMY WITH RADIOACTIVE SEED LOCALIZATION (Right: Breast)     Patient location during evaluation: PACU Anesthesia Type: General Level of consciousness: awake and alert and oriented Pain management: pain level controlled Vital Signs Assessment: post-procedure vital signs reviewed and stable Respiratory status: spontaneous breathing, nonlabored ventilation and respiratory function stable Cardiovascular status: blood pressure returned to baseline and stable Postop Assessment: no apparent nausea or vomiting Anesthetic complications: no   No notable events documented.  Last Vitals:  Vitals:   09/21/22 0845 09/21/22 0900  BP: 137/83 (!) 136/91  Pulse: 74 71  Resp: 14 20  Temp:  (!) 36.1 C  SpO2: 100% 99%    Last Pain:  Vitals:   09/21/22 0900  TempSrc:   PainSc: 0-No pain                 Jayleen Scaglione A.

## 2022-09-21 NOTE — Interval H&P Note (Signed)
History and Physical Interval Note:  09/21/2022 7:13 AM  [name]  has presented today for surgery, with the diagnosis of RIGHT BREAST DCIS.  The various methods of treatment have been discussed with the patient and family. After consideration of risks, benefits and other options for treatment, the patient has consented to  Procedure(s): RIGHT BREAST LUMPECTOMY WITH RADIOACTIVE SEED LOCALIZATION (Right) as a surgical intervention.  The patient's history has been reviewed, patient examined, no change in status, stable for surgery.  I have reviewed the patient's chart and labs.  Questions were answered to the patient's satisfaction.     [place]

## 2022-09-22 ENCOUNTER — Encounter (HOSPITAL_COMMUNITY): Payer: Self-pay | Admitting: Surgery

## 2022-09-27 ENCOUNTER — Encounter: Payer: Self-pay | Admitting: Surgery

## 2022-09-27 LAB — SURGICAL PATHOLOGY

## 2022-09-28 ENCOUNTER — Other Ambulatory Visit (HOSPITAL_COMMUNITY): Payer: Self-pay

## 2022-09-28 ENCOUNTER — Encounter: Payer: Self-pay | Admitting: *Deleted

## 2022-09-29 ENCOUNTER — Other Ambulatory Visit (HOSPITAL_COMMUNITY): Payer: Self-pay

## 2022-10-05 ENCOUNTER — Encounter (HOSPITAL_COMMUNITY): Payer: Self-pay

## 2022-10-05 DIAGNOSIS — Z13220 Encounter for screening for lipoid disorders: Secondary | ICD-10-CM | POA: Diagnosis not present

## 2022-10-05 DIAGNOSIS — Z6832 Body mass index (BMI) 32.0-32.9, adult: Secondary | ICD-10-CM | POA: Diagnosis not present

## 2022-10-05 DIAGNOSIS — E790 Hyperuricemia without signs of inflammatory arthritis and tophaceous disease: Secondary | ICD-10-CM | POA: Diagnosis not present

## 2022-10-05 DIAGNOSIS — Z862 Personal history of diseases of the blood and blood-forming organs and certain disorders involving the immune mechanism: Secondary | ICD-10-CM | POA: Diagnosis not present

## 2022-10-05 DIAGNOSIS — R7303 Prediabetes: Secondary | ICD-10-CM | POA: Diagnosis not present

## 2022-10-05 DIAGNOSIS — I10 Essential (primary) hypertension: Secondary | ICD-10-CM | POA: Diagnosis not present

## 2022-10-19 ENCOUNTER — Ambulatory Visit
Admission: RE | Admit: 2022-10-19 | Discharge: 2022-10-19 | Disposition: A | Discharge status: Home / Self Care | Payer: Commercial Managed Care - PPO | Source: Ambulatory Visit | Attending: Radiation Oncology | Admitting: Radiation Oncology

## 2022-10-19 ENCOUNTER — Encounter: Payer: Self-pay | Admitting: Radiation Oncology

## 2022-10-19 VITALS — BP 131/75 | HR 71 | Temp 97.1°F | Resp 18 | Ht 66.0 in | Wt 182.5 lb

## 2022-10-19 DIAGNOSIS — I10 Essential (primary) hypertension: Secondary | ICD-10-CM | POA: Insufficient documentation

## 2022-10-19 DIAGNOSIS — D0511 Intraductal carcinoma in situ of right breast: Secondary | ICD-10-CM

## 2022-10-19 DIAGNOSIS — D509 Iron deficiency anemia, unspecified: Secondary | ICD-10-CM | POA: Insufficient documentation

## 2022-10-19 DIAGNOSIS — M129 Arthropathy, unspecified: Secondary | ICD-10-CM | POA: Insufficient documentation

## 2022-10-19 DIAGNOSIS — Z79899 Other long term (current) drug therapy: Secondary | ICD-10-CM | POA: Insufficient documentation

## 2022-10-19 DIAGNOSIS — Z170 Estrogen receptor positive status [ER+]: Secondary | ICD-10-CM | POA: Insufficient documentation

## 2022-10-19 DIAGNOSIS — E538 Deficiency of other specified B group vitamins: Secondary | ICD-10-CM | POA: Insufficient documentation

## 2022-10-19 DIAGNOSIS — Z510 Encounter for antineoplastic radiation therapy: Secondary | ICD-10-CM | POA: Diagnosis present

## 2022-10-19 NOTE — Progress Notes (Signed)
Radiation Oncology         [phone] ________________________________  Name: [name]        MRN: [id number]  Date of Service: 10/19/2022 DOB: [birth year]  CC:Lurline Del, DO  Benay Pike, MD     REFERRING PHYSICIAN: Benay Pike, MD   DIAGNOSIS: The encounter diagnosis was Ductal carcinoma in situ (DCIS) of right breast.   HISTORY OF PRESENT ILLNESS: [name] is a 62 y.o. female with a diagnosis of right breast cancer.  The patient was found to have screening detected calcifications in on further diagnostic imaging a span of calcifications in the lower outer quadrant of the right breast were noted measuring up to 3.9 cm.  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  The cancer was ER/PR positive.    Since her last visit she has undergone a right lumpectomy on 09/21/2022 which showed intermediate grade DCIS with calcifications and necrosis.  Multiple margins were considered close with her inferior less than 1 mm, anterior 1 mm, medial 1.5 mm but her superior was 3 mm.  Given these findings she is seen today to discuss adjuvant radiotherapy.    PREVIOUS RADIATION THERAPY: No   PAST MEDICAL HISTORY:  Past Medical History:  Diagnosis Date   ANEMIA-IRON DEFICIENCY 01/15/2008   ARTHRITIS 01/15/2008   BACK PAIN 02/07/2009   CELLULITIS, LEG, LEFT 04/25/2008   GERD 09/24/2010   HEADACHES, HX OF 01/15/2008   HYPERTENSION 01/15/2008   LUMBAR RADICULOPATHY, LEFT 10/20/2010   Prediabetes 11/2016   UTI'S, HX OF 01/15/2008   VITAMIN B12 DEFICIENCY 03/26/2010       PAST SURGICAL HISTORY: Past Surgical History:  Procedure Laterality Date   ABDOMINAL HYSTERECTOMY  1994   fibroids   BILATERAL OOPHORECTOMY     BREAST BIOPSY Right 08/11/2022   MM RT BREAST BX W LOC DEV 1ST LESION IMAGE BX SPEC STEREO GUIDE 08/11/2022 [facility]   BREAST BIOPSY  09/20/2022   MM RT RADIOACTIVE SEED LOC MAMMO GUIDE 09/20/2022 [facility]  MAMMOGRAP[facility]   BREAST LUMPECTOMY WITH RADIOACTIVE SEED LOCALIZATION Right 09/21/2022   Procedure: RIGHT BREAST LUMPECTOMY WITH RADIOACTIVE SEED LOCALIZATION;  Surgeon: Erroll Luna, MD;  Location: [facility];  Service: General;  Laterality: Right;   CHOLECYSTECTOMY     s/p arthroscopic right knee sugury  10/09   Dr. Eddie Dibbles     FAMILY HISTORY:  Family History  Problem Relation Age of Onset   Arthritis Mother        OA   Diabetes Mother    Hypertension Mother    Heart disease Mother    Hypertension Father    Stroke Father    Diabetes Father    Arthritis Father        RA   Breast cancer Sister    Prostate cancer Brother    Cancer Other        stomach     SOCIAL HISTORY:  reports that she has never smoked. She has never used smokeless tobacco. She reports that she does not drink alcohol and does not use drugs.  The patient is married and lives in [place].  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   ALLERGIES: Aspirin   MEDICATIONS:  Current Outpatient Medications  Medication Sig Dispense Refill   allopurinol (ZYLOPRIM) 100 MG tablet Take 1 tablet (100 mg total) by mouth daily. 90 tablet 1   allopurinol (ZYLOPRIM) 100 MG tablet Take 1  tablet (100 mg total) by mouth daily 90 tablet 2   ferrous sulfate 325 (65 FE) MG EC tablet Take 325 mg by mouth every other day.     lisinopril (PRINIVIL,ZESTRIL) 20 MG tablet TAKE ONE TABLET BY MOUTH EVERY DAY 90 tablet 2   lisinopril-hydrochlorothiazide (ZESTORETIC) 20-12.5 MG tablet Take 1 tablet by mouth daily. (Patient not taking: Reported on 09/09/2022) 90 tablet 3   lisinopril-hydrochlorothiazide (ZESTORETIC) 20-12.5 MG tablet Take 1 tablet by mouth daily. 90 tablet 1   oxyCODONE (OXY IR/ROXICODONE) 5 MG immediate release tablet Take 1 tablet (5 mg total) by mouth every 6 (six) hours as needed for severe pain. 15 tablet 0   potassium chloride SA (KLOR-CON) 20 MEQ tablet Take 1 tablet (20 mEq  total) by mouth daily with food (Patient not taking: Reported on 09/09/2022) 5 tablet 0   Vitamin D, Ergocalciferol, (DRISDOL) 1.25 MG (50000 UNIT) CAPS capsule Take 1 capsule (50,000 Units total) by mouth 2 (two) times a week. (Patient not taking: Reported on 09/09/2022) 30 capsule 3   Vitamin D, Ergocalciferol, (DRISDOL) 1.25 MG (50000 UNIT) CAPS capsule Take 1 capsule (50,000 Units total) by mouth once a week 12 capsule 1   No current facility-administered medications for this visit.     REVIEW OF SYSTEMS: On review of systems, the patient reports that she is doing well overall since surgery. She denies any concerns with range of motion of her upper extremities. She does have some firmness of her breast since surgery but minimal pain at this time. No other complaints are verbalized.     PHYSICAL EXAM:  Wt Readings from Last 3 Encounters:  09/21/22 173 lb (78.5 kg)  09/14/22 178 lb 4.8 oz (80.9 kg)  08/31/22 177 lb 3.2 oz (80.4 kg)   Temp Readings from Last 3 Encounters:  09/21/22 (!) 97 F (36.1 C)  09/14/22 97.6 F (36.4 C) (Oral)  08/31/22 97.9 F (36.6 C)   BP Readings from Last 3 Encounters:  09/21/22 (!) 136/91  09/14/22 138/71  08/31/22 125/74   Pulse Readings from Last 3 Encounters:  09/21/22 71  09/14/22 65  08/31/22 70    In general this is a well appearing African American female in no acute distress. She's alert and oriented x4 and appropriate throughout the examination. Cardiopulmonary assessment is negative for acute distress and she exhibits normal effort. Her right breast incision is well healed without erythema, separation, or drainage. There is mild firmness consistent with induration.     ECOG = 0  0 - Asymptomatic (Fully active, able to carry on all predisease activities without restriction)  1 - Symptomatic but completely ambulatory (Restricted in physically strenuous activity but ambulatory and able to carry out work of a light or sedentary nature.  For example, light housework, office work)  2 - Symptomatic, <50% in bed during the day (Ambulatory and capable of all self care but unable to carry out any work activities. Up and about more than 50% of waking hours)  3 - Symptomatic, >50% in bed, but not bedbound (Capable of only limited self-care, confined to bed or chair 50% or more of waking hours)  4 - Bedbound (Completely disabled. Cannot carry on any self-care. Totally confined to bed or chair)  5 - Death   Eustace Pen MM, Creech RH, Tormey DC, et al. [phone]). "Toxicity and response criteria of the [facility]". [place] Oncol. 5 (6): 649-55    LABORATORY DATA:  Lab Results  Component Value  Date   WBC 4.4 09/14/2022   HGB 11.8 (L) 09/14/2022   HCT 36.4 09/14/2022   MCV 75.2 (L) 09/14/2022   PLT 307 09/14/2022   Lab Results  Component Value Date   NA 140 09/14/2022   K 3.6 09/14/2022   CL 105 09/14/2022   CO2 26 09/14/2022   Lab Results  Component Value Date   ALT 31 03/24/2011   AST 26 03/24/2011   ALKPHOS 68 03/24/2011   BILITOT 0.8 03/24/2011      RADIOGRAPHY: MM Breast Surgical Specimen  Result Date: 09/21/2022 CLINICAL DATA:  Status post excision of a right breast lesion following radioactive seed localization. Assess surgical specimen. EXAM: SPECIMEN RADIOGRAPH OF THE RIGHT BREAST COMPARISON:  Previous exam(s). FINDINGS: Status post excision of the right breast. The radioactive seed and biopsy marker clip are present, completely intact, and were marked for pathology. IMPRESSION: Specimen radiograph of the right breast. Electronically Signed   By: Lajean Manes M.D.   On: 09/21/2022 08:13  MM RT RADIOACTIVE SEED LOC MAMMO GUIDE  Result Date: 09/20/2022 CLINICAL DATA:  Biopsy proven ductal carcinoma in-situ. Radioactive seed placement prior to surgery EXAM: MAMMOGRAPHIC GUIDED RADIOACTIVE SEED LOCALIZATION OF THE RIGHT BREAST COMPARISON:  Previous exam(s). FINDINGS: Patient presents for  radioactive seed localization prior to surgery. I met with the patient and we discussed the procedure of seed localization including benefits and alternatives. We discussed the high likelihood of a successful procedure. We discussed the risks of the procedure including infection, bleeding, tissue injury and further surgery. We discussed the low dose of radioactivity involved in the procedure. Informed, written consent was given. The usual time-out protocol was performed immediately prior to the procedure. Using mammographic guidance, sterile technique, 1% lidocaine and an I-125 radioactive seed, the coil shaped clip was localized using a superior to inferior approach. The follow-up mammogram images confirm the seed in the expected location and were marked for Dr. Brantley Stage. Follow-up survey of the patient confirms presence of the radioactive seed. Order number of I-125 seed:  [id number]. Total activity:  [id number] millicuries reference Date: 08/31/2022 The patient tolerated the procedure well and was released from the [facility]. She was given instructions regarding seed removal. IMPRESSION: Radioactive seed localization the right breast. No apparent complications. Electronically Signed   By: Lillia Mountain M.D.   On: 09/20/2022 14:47      IMPRESSION/PLAN: 1. Intermediate Grade, ER/PR positive DCIS of the right breast. Dr. Lisbeth Renshaw has reviewed her final pathology findings and today we reviewed the nature of early stage right breast disease. She has done well since surgery. Dr. Lisbeth Renshaw would recommend external radiotherapy to the breast  to reduce risks of local recurrence followed by antiestrogen therapy as recommended by Dr. Chryl Heck. We discussed the risks, benefits, short, and long term effects of radiotherapy, as well as the curative intent, and the patient is interested in proceeding. I reviewed the delivery and logistics of radiotherapy and Dr. Lisbeth Renshaw still recommends 4 weeks of radiotherapy to the right breast. Written  consent is obtained and placed in the chart, a copy was provided to the patient. She will simulate today.     In a visit lasting 45 minutes, greater than 50% of the time was spent face to face reviewing her case, as well as in preparation of, discussing, and coordinating the patient's care.       Carola Rhine, [facility]    **Disclaimer: This note was dictated with voice recognition software. Similar sounding words can inadvertently be  transcribed and this note may contain transcription errors which may not have been corrected upon publication of note.**

## 2022-10-19 NOTE — Progress Notes (Signed)
Follow-up-new nursing interview for Ductal carcinoma in situ (DCIS) of right breast.  Patient identity verified. Patient reports occasional, sharp pains in RT breast 5/10. No other issues reported at this time.  Meaningful use complete. Hysterectomy  BP 131/75 (BP Location: Left Arm, Patient Position: Sitting, Cuff Size: Large)   Pulse 71   Temp (!) 97.1 F (36.2 C) (Temporal)   Resp 18   Ht '5\' 6"'$  (1.676 m)   Wt 182 lb 8 oz (82.8 kg)   SpO2 100%   BMI 29.46 kg/m    This concludes the interview.   Leandra Kern, LPN

## 2022-10-26 DIAGNOSIS — D0511 Intraductal carcinoma in situ of right breast: Secondary | ICD-10-CM | POA: Diagnosis not present

## 2022-10-26 DIAGNOSIS — Z510 Encounter for antineoplastic radiation therapy: Secondary | ICD-10-CM | POA: Diagnosis not present

## 2022-10-26 DIAGNOSIS — Z170 Estrogen receptor positive status [ER+]: Secondary | ICD-10-CM | POA: Diagnosis not present

## 2022-10-28 ENCOUNTER — Encounter: Payer: Self-pay | Admitting: *Deleted

## 2022-10-28 ENCOUNTER — Inpatient Hospital Stay: Payer: Commercial Managed Care - PPO

## 2022-10-28 ENCOUNTER — Inpatient Hospital Stay: Payer: Commercial Managed Care - PPO | Attending: Hematology and Oncology | Admitting: Genetic Counselor

## 2022-10-28 ENCOUNTER — Other Ambulatory Visit: Payer: Self-pay

## 2022-10-28 DIAGNOSIS — Z8042 Family history of malignant neoplasm of prostate: Secondary | ICD-10-CM | POA: Diagnosis not present

## 2022-10-28 DIAGNOSIS — D0510 Intraductal carcinoma in situ of unspecified breast: Secondary | ICD-10-CM | POA: Diagnosis not present

## 2022-10-28 DIAGNOSIS — Z803 Family history of malignant neoplasm of breast: Secondary | ICD-10-CM | POA: Diagnosis not present

## 2022-10-28 DIAGNOSIS — D0511 Intraductal carcinoma in situ of right breast: Secondary | ICD-10-CM | POA: Insufficient documentation

## 2022-10-28 DIAGNOSIS — Z510 Encounter for antineoplastic radiation therapy: Secondary | ICD-10-CM | POA: Insufficient documentation

## 2022-11-02 ENCOUNTER — Other Ambulatory Visit: Payer: Self-pay

## 2022-11-02 ENCOUNTER — Ambulatory Visit
Admission: RE | Admit: 2022-11-02 | Discharge: 2022-11-02 | Disposition: A | Discharge status: Home / Self Care | Payer: Commercial Managed Care - PPO | Source: Ambulatory Visit | Attending: Radiation Oncology | Admitting: Radiation Oncology

## 2022-11-02 DIAGNOSIS — Z170 Estrogen receptor positive status [ER+]: Secondary | ICD-10-CM | POA: Diagnosis not present

## 2022-11-02 DIAGNOSIS — D0511 Intraductal carcinoma in situ of right breast: Secondary | ICD-10-CM | POA: Diagnosis not present

## 2022-11-02 DIAGNOSIS — Z510 Encounter for antineoplastic radiation therapy: Secondary | ICD-10-CM | POA: Diagnosis not present

## 2022-11-02 LAB — RAD ONC ARIA SESSION SUMMARY
Course Elapsed Days: 0
Plan Fractions Treated to Date: 1
Plan Prescribed Dose Per Fraction: 2.66 Gy
Plan Total Fractions Prescribed: 16
Plan Total Prescribed Dose: 42.56 Gy
Reference Point Dosage Given to Date: 2.66 Gy
Reference Point Session Dosage Given: 2.66 Gy
Session Number: 1

## 2022-11-03 ENCOUNTER — Other Ambulatory Visit: Payer: Self-pay | Admitting: *Deleted

## 2022-11-03 ENCOUNTER — Inpatient Hospital Stay: Payer: Commercial Managed Care - PPO

## 2022-11-03 ENCOUNTER — Other Ambulatory Visit: Payer: Self-pay

## 2022-11-03 ENCOUNTER — Ambulatory Visit
Admission: RE | Admit: 2022-11-03 | Discharge: 2022-11-03 | Disposition: A | Discharge status: Home / Self Care | Payer: Commercial Managed Care - PPO | Source: Ambulatory Visit | Attending: Radiation Oncology | Admitting: Radiation Oncology

## 2022-11-03 DIAGNOSIS — Z170 Estrogen receptor positive status [ER+]: Secondary | ICD-10-CM | POA: Diagnosis not present

## 2022-11-03 DIAGNOSIS — D508 Other iron deficiency anemias: Secondary | ICD-10-CM

## 2022-11-03 DIAGNOSIS — Z510 Encounter for antineoplastic radiation therapy: Secondary | ICD-10-CM | POA: Diagnosis not present

## 2022-11-03 DIAGNOSIS — D0511 Intraductal carcinoma in situ of right breast: Secondary | ICD-10-CM | POA: Diagnosis not present

## 2022-11-03 LAB — RAD ONC ARIA SESSION SUMMARY
Course Elapsed Days: 1
Plan Fractions Treated to Date: 2
Plan Prescribed Dose Per Fraction: 2.66 Gy
Plan Total Fractions Prescribed: 16
Plan Total Prescribed Dose: 42.56 Gy
Reference Point Dosage Given to Date: 5.32 Gy
Reference Point Session Dosage Given: 2.66 Gy
Session Number: 2

## 2022-11-03 LAB — CBC WITH DIFFERENTIAL (CANCER CENTER ONLY)
Abs Immature Granulocytes: 0 10*3/uL (ref 0.00–0.07)
Basophils Absolute: 0 10*3/uL (ref 0.0–0.1)
Basophils Relative: 1 %
Eosinophils Absolute: 0.1 10*3/uL (ref 0.0–0.5)
Eosinophils Relative: 3 %
HCT: 36.3 % (ref 36.0–46.0)
Hemoglobin: 11.7 g/dL — ABNORMAL LOW (ref 12.0–15.0)
Immature Granulocytes: 0 %
Lymphocytes Relative: 45 %
Lymphs Abs: 1.6 10*3/uL (ref 0.7–4.0)
MCH: 24.3 pg — ABNORMAL LOW (ref 26.0–34.0)
MCHC: 32.2 g/dL (ref 30.0–36.0)
MCV: 75.3 fL — ABNORMAL LOW (ref 80.0–100.0)
Monocytes Absolute: 0.3 10*3/uL (ref 0.1–1.0)
Monocytes Relative: 7 %
Neutro Abs: 1.6 10*3/uL — ABNORMAL LOW (ref 1.7–7.7)
Neutrophils Relative %: 44 %
Platelet Count: 340 10*3/uL (ref 150–400)
RBC: 4.82 MIL/uL (ref 3.87–5.11)
RDW: 15.6 % — ABNORMAL HIGH (ref 11.5–15.5)
WBC Count: 3.5 10*3/uL — ABNORMAL LOW (ref 4.0–10.5)
nRBC: 0 % (ref 0.0–0.2)

## 2022-11-03 LAB — FERRITIN: Ferritin: 182 ng/mL (ref 11–307)

## 2022-11-04 ENCOUNTER — Ambulatory Visit
Admission: RE | Admit: 2022-11-04 | Discharge: 2022-11-04 | Disposition: A | Discharge status: Home / Self Care | Payer: Commercial Managed Care - PPO | Source: Ambulatory Visit | Attending: Radiation Oncology | Admitting: Radiation Oncology

## 2022-11-04 ENCOUNTER — Other Ambulatory Visit: Payer: Self-pay

## 2022-11-04 DIAGNOSIS — D0511 Intraductal carcinoma in situ of right breast: Secondary | ICD-10-CM | POA: Diagnosis not present

## 2022-11-04 DIAGNOSIS — Z510 Encounter for antineoplastic radiation therapy: Secondary | ICD-10-CM | POA: Diagnosis not present

## 2022-11-04 DIAGNOSIS — Z170 Estrogen receptor positive status [ER+]: Secondary | ICD-10-CM | POA: Diagnosis not present

## 2022-11-04 LAB — RAD ONC ARIA SESSION SUMMARY
Course Elapsed Days: 2
Plan Fractions Treated to Date: 3
Plan Prescribed Dose Per Fraction: 2.66 Gy
Plan Total Fractions Prescribed: 16
Plan Total Prescribed Dose: 42.56 Gy
Reference Point Dosage Given to Date: 7.98 Gy
Reference Point Session Dosage Given: 2.66 Gy
Session Number: 3

## 2022-11-04 LAB — IRON AND IRON BINDING CAPACITY (CC-WL,HP ONLY)
Iron: 85 ug/dL (ref 28–170)
Saturation Ratios: 23 % (ref 10.4–31.8)
TIBC: 368 ug/dL (ref 250–450)
UIBC: 283 ug/dL (ref 148–442)

## 2022-11-05 ENCOUNTER — Other Ambulatory Visit: Payer: Self-pay

## 2022-11-05 ENCOUNTER — Ambulatory Visit
Admission: RE | Admit: 2022-11-05 | Discharge: 2022-11-05 | Disposition: A | Discharge status: Home / Self Care | Payer: Commercial Managed Care - PPO | Source: Ambulatory Visit | Attending: Radiation Oncology | Admitting: Radiation Oncology

## 2022-11-05 DIAGNOSIS — D0511 Intraductal carcinoma in situ of right breast: Secondary | ICD-10-CM | POA: Diagnosis not present

## 2022-11-05 DIAGNOSIS — Z170 Estrogen receptor positive status [ER+]: Secondary | ICD-10-CM | POA: Diagnosis not present

## 2022-11-05 DIAGNOSIS — Z510 Encounter for antineoplastic radiation therapy: Secondary | ICD-10-CM | POA: Diagnosis not present

## 2022-11-05 LAB — RAD ONC ARIA SESSION SUMMARY
Course Elapsed Days: 3
Plan Fractions Treated to Date: 4
Plan Prescribed Dose Per Fraction: 2.66 Gy
Plan Total Fractions Prescribed: 16
Plan Total Prescribed Dose: 42.56 Gy
Reference Point Dosage Given to Date: 10.64 Gy
Reference Point Session Dosage Given: 2.66 Gy
Session Number: 4

## 2022-11-05 MED ORDER — ALRA NON-METALLIC DEODORANT (RAD-ONC)
1.0000 | Freq: Once | TOPICAL | Status: AC
  Administered 2022-11-05: 1 via TOPICAL

## 2022-11-05 MED ORDER — RADIAPLEXRX EX GEL: Freq: Once | CUTANEOUS | Status: AC

## 2022-11-06 ENCOUNTER — Encounter: Payer: Self-pay | Admitting: Genetic Counselor

## 2022-11-06 NOTE — Progress Notes (Signed)
REFERRING PROVIDER: Hayden Pedro, PA-C [place],  [place] [id number]  PRIMARY PROVIDER:  Lurline Del, DO  PRIMARY REASON FOR VISIT:  1. Ductal carcinoma in situ (DCIS) of right breast   2. Family history of breast cancer   3. Family history of prostate cancer    HISTORY OF PRESENT ILLNESS:   [name], a 62 y.o. female, was seen for a [facility] cancer genetics consultation at the request of Shona Simpson, PA-C due to a personal and family history of breast cancer.  [name] presents to clinic today to discuss the possibility of a hereditary predisposition to cancer, to discuss genetic testing, and to further clarify her future cancer risks, as well as potential cancer risks for family members.   In December 2023, at the age of 75, [name] was diagnosed with ductal carcinoma in situ of the right breast (ER+/PR+) s/p lumpectomy.    CANCER HISTORY:  Oncology History  DCIS (ductal carcinoma in situ)  07/13/2022 Mammogram   Bilateral screening mammogram showed calcifications in the right breast.  Unilateral diagnostic mammogram showed suspicious right breast calcifications.   08/11/2022 Pathology Results   Right breast needle core biopsy showed intermediate grade DCIS, ER/PR +100% and 95%, strong staining intensity   08/30/2022 Initial Diagnosis   DCIS (ductal carcinoma in situ)      RISK FACTORS:  Colonoscopy: ~2022 per patient; less than 10 lifetime polyps. Hysterectomy: yes.  Ovaries intact: no.  Dermatology screening: no   Past Medical History:  Diagnosis Date   ANEMIA-IRON DEFICIENCY 01/15/2008   ARTHRITIS 01/15/2008   BACK PAIN 02/07/2009   CELLULITIS, LEG, LEFT 04/25/2008   GERD 09/24/2010   HEADACHES, HX OF 01/15/2008   HYPERTENSION 01/15/2008   LUMBAR RADICULOPATHY, LEFT 10/20/2010   Prediabetes 11/2016   UTI'S, HX OF 01/15/2008   VITAMIN B12 DEFICIENCY 03/26/2010    Past Surgical History:  Procedure Laterality Date   ABDOMINAL  HYSTERECTOMY  1994   fibroids   BILATERAL OOPHORECTOMY     BREAST BIOPSY Right 08/11/2022   MM RT BREAST BX W LOC DEV 1ST LESION IMAGE BX SPEC STEREO GUIDE 08/11/2022 [facility]   BREAST BIOPSY  09/20/2022   MM RT RADIOACTIVE SEED LOC MAMMO GUIDE 09/20/2022 [facility]   BREAST LUMPECTOMY WITH RADIOACTIVE SEED LOCALIZATION Right 09/21/2022   Procedure: RIGHT BREAST LUMPECTOMY WITH RADIOACTIVE SEED LOCALIZATION;  Surgeon: Erroll Luna, MD;  Location: [facility];  Service: General;  Laterality: Right;   CHOLECYSTECTOMY     s/p arthroscopic right knee sugury  10/09   Dr. Eddie Dibbles    FAMILY HISTORY:  We obtained a detailed, 4-generation family history.  Significant diagnoses are listed below: Family History  Problem Relation Age of Onset   Breast cancer Sister        dx <50; mat half sister   Prostate cancer Brother 32       mat half brother   Breast cancer Cousin        mat female cousin     [name] is unaware of previous family history of genetic testing for hereditary cancer risks. There is no reported Ashkenazi Jewish ancestry. There is no known consanguinity.  GENETIC COUNSELING ASSESSMENT: [name] is a 62 y.o. female with a personal and family history which is somewhat suggestive of a hereditary cancer syndrome given the presence of related cancers (breast, prostate) in the family. We, therefore, discussed and recommended the following at today's visit.   DISCUSSION: We discussed that  5 - 10% of cancer is hereditary.  Most cases of hereditary breast cancer are associated with mutations in BRCA1/2.  There are other genes that can be associated with hereditary breast or prostate cancer syndromes.  We discussed that testing is beneficial for several reasons including knowing how to follow individuals for their cancer risks and understanding if other family members could be at risk for cancer and allowing them to undergo genetic testing.   We reviewed the characteristics,  features and inheritance patterns of hereditary cancer syndromes. We also discussed genetic testing, including the appropriate family members to test, the process of testing, insurance coverage and turn-around-time for results. We discussed the implications of a negative, positive, carrier and/or variant of uncertain significant result. We recommended [name] pursue genetic testing for a panel that includes genes associated with breast, prostate, and other cancers.     The [facility] Common Hereditary Cancers + RNA Panel includes sequencing, deletion/duplication, and RNA analysis of the following 48 genes: APC, ATM, AXIN2, BAP1, BARD1, BMPR1A, BRCA1, BRCA2, BRIP1, CDH1, CDK4*, CDKN2A*, CHEK2, CTNNA1, DICER1, EPCAM* (del/dup only), FH, GREM1* (promoter dup analysis only), HOXB13*, KIT*, MBD4*, MEN1, MLH1, MSH2, MSH3, MSH6, MUTYH, NF1, NTHL1, PALB2, PDGFRA*, PMS2, POLD1, POLE, PTEN, RAD51C, RAD51D, SDHA (sequencing only), SDHB, SDHC, SDHD, SMAD4, SMARCA4, STK11, TP53, TSC1, TSC2, VHL.  *Genes without RNA analysis.   Based on Ms. Birr's personal and family history of breast cancer, she meets medical criteria for genetic testing. Despite that she meets criteria, she may still have an out of pocket cost. We discussed that if her out of pocket cost for testing is over $100, the laboratory should contact her and discuss the self-pay prices and/or patient pay assistance programs.    PLAN: After considering the risks, benefits, and limitations, [name] provided informed consent to pursue genetic testing and the blood sample was sent to [facility] for analysis of the Common Hereditary Cancers +RNA Panel. Results should be available within approximately 3 weeks' time, at which point they will be disclosed by telephone to [name], as will any additional recommendations warranted by these results. [name] will receive a summary of her genetic counseling visit and a copy of her results once available.  This information will also be available in Epic.   [name]'s questions were answered to her satisfaction today. Our contact information was provided should additional questions or concerns arise. Thank you for the referral and allowing [place] to share in the care of your patient.   Kateryn Marasigan M. Joette Catching, [place], [facility] Genetic Counselor [email or web address] (P) [phone]  The patient was seen for a total of 30 minutes in face-to-face genetic counseling.   Dr. Chryl Heck was available to discuss this case as needed.

## 2022-11-08 ENCOUNTER — Telehealth: Payer: Self-pay | Admitting: Hematology and Oncology

## 2022-11-08 ENCOUNTER — Other Ambulatory Visit (HOSPITAL_COMMUNITY): Payer: Self-pay

## 2022-11-08 ENCOUNTER — Other Ambulatory Visit: Payer: Self-pay

## 2022-11-08 ENCOUNTER — Ambulatory Visit
Admission: RE | Admit: 2022-11-08 | Discharge: 2022-11-08 | Disposition: A | Discharge status: Home / Self Care | Payer: Commercial Managed Care - PPO | Source: Ambulatory Visit | Attending: Radiation Oncology | Admitting: Radiation Oncology

## 2022-11-08 ENCOUNTER — Inpatient Hospital Stay (HOSPITAL_BASED_OUTPATIENT_CLINIC_OR_DEPARTMENT_OTHER): Payer: Commercial Managed Care - PPO | Admitting: Hematology and Oncology

## 2022-11-08 VITALS — BP 140/63 | HR 80 | Temp 98.1°F | Resp 18 | Ht 66.0 in | Wt 180.6 lb

## 2022-11-08 DIAGNOSIS — D0511 Intraductal carcinoma in situ of right breast: Secondary | ICD-10-CM | POA: Diagnosis not present

## 2022-11-08 DIAGNOSIS — Z170 Estrogen receptor positive status [ER+]: Secondary | ICD-10-CM | POA: Diagnosis not present

## 2022-11-08 DIAGNOSIS — Z510 Encounter for antineoplastic radiation therapy: Secondary | ICD-10-CM | POA: Diagnosis not present

## 2022-11-08 LAB — RAD ONC ARIA SESSION SUMMARY
Course Elapsed Days: 6
Plan Fractions Treated to Date: 5
Plan Prescribed Dose Per Fraction: 2.66 Gy
Plan Total Fractions Prescribed: 16
Plan Total Prescribed Dose: 42.56 Gy
Reference Point Dosage Given to Date: 13.3 Gy
Reference Point Session Dosage Given: 2.66 Gy
Session Number: 5

## 2022-11-08 MED ORDER — ANASTROZOLE 1 MG PO TABS
1.0000 mg | ORAL_TABLET | Freq: Every day | ORAL | 3 refills | Status: DC
  Filled 2022-11-08: qty 90, 90d supply, fill #0
  Filled 2023-03-04: qty 90, 90d supply, fill #1
  Filled 2023-05-26 – 2023-05-27 (×2): qty 90, 90d supply, fill #2
  Filled 2023-05-27: qty 3, 3d supply, fill #2
  Filled 2023-05-27: qty 50, 50d supply, fill #2
  Filled 2023-05-27: qty 40, 40d supply, fill #2
  Filled 2023-08-03 – 2023-08-11 (×2): qty 90, 90d supply, fill #3

## 2022-11-08 NOTE — Assessment & Plan Note (Signed)
This is a very pleasant 62 year old postmenopausal female patient with newly diagnosed right breast DCIS referred to medical oncology for recommendations.  She is now status postlumpectomy and is undergoing adjuvant radiation.  She anticipates radiation to be done around mid April.  She is tolerating it well so far.  After completing radiation, she will start antiestrogen therapy.  We have once again discussed about tamoxifen versus aromatase inhibitors.  She is interested in trying aromatase inhibitors.  We have discussed about mechanism of action of aromatase inhibitors, adverse effects of aromatase inhibitors including but not limited to vaginal dryness, arthralgias, hot flashes, bone density loss etc.  She is willing to try this.  Prescription has been dispensed to the pharmacy of her choice.  She was clearly instructed to start it in the first week of May.  I will also order a baseline bone density.  She will return to clinic in August for survivorship visit and toxicity check.

## 2022-11-08 NOTE — Progress Notes (Signed)
[facility] NOTE  Patient Care Team: Lurline Del, DO as PCP - General (Family Medicine) Rockwell Germany, RN as Oncology Nurse Navigator Mauro Kaufmann, RN as Oncology Nurse Navigator Erroll Luna, MD as Consulting Physician (General Surgery) Benay Pike, MD as Consulting Physician (Hematology and Oncology) Kyung Rudd, MD as Consulting Physician (Radiation Oncology)  CHIEF COMPLAINTS/PURPOSE OF CONSULTATION:  Newly diagnosed breast cancer  HISTORY OF PRESENTING ILLNESS:  [name] 62 y.o. female is here because of recent diagnosis of right breast DCIS  I reviewed her records extensively and collaborated the history with the patient.  SUMMARY OF ONCOLOGIC HISTORY: Oncology History  DCIS (ductal carcinoma in situ)  07/13/2022 Mammogram   Bilateral screening mammogram showed calcifications in the right breast.  Unilateral diagnostic mammogram showed suspicious right breast calcifications.   08/11/2022 Pathology Results   Right breast needle core biopsy showed intermediate grade DCIS, ER/PR +100% and 95%, strong staining intensity   08/30/2022 Initial Diagnosis   DCIS (ductal carcinoma in situ)    [name] is here for a follow up. She is now undergoing radiation, anticipate completing mid April. She is agreeable to taking anti estrogen therapy after radiation. Rest of the pertinent 10 point ROS reviewed and neg.  MEDICAL HISTORY:  Past Medical History:  Diagnosis Date   ANEMIA-IRON DEFICIENCY 01/15/2008   ARTHRITIS 01/15/2008   BACK PAIN 02/07/2009   CELLULITIS, LEG, LEFT 04/25/2008   GERD 09/24/2010   HEADACHES, HX OF 01/15/2008   HYPERTENSION 01/15/2008   LUMBAR RADICULOPATHY, LEFT 10/20/2010   Prediabetes 11/2016   UTI'S, HX OF 01/15/2008   VITAMIN B12 DEFICIENCY 03/26/2010    SURGICAL HISTORY: Past Surgical History:  Procedure Laterality Date   ABDOMINAL HYSTERECTOMY  1994   fibroids   BILATERAL OOPHORECTOMY     BREAST  BIOPSY Right 08/11/2022   MM RT BREAST BX W LOC DEV 1ST LESION IMAGE BX SPEC STEREO GUIDE 08/11/2022 [facility]   BREAST BIOPSY  09/20/2022   MM RT RADIOACTIVE SEED LOC MAMMO GUIDE 09/20/2022 [facility]   BREAST LUMPECTOMY WITH RADIOACTIVE SEED LOCALIZATION Right 09/21/2022   Procedure: RIGHT BREAST LUMPECTOMY WITH RADIOACTIVE SEED LOCALIZATION;  Surgeon: Erroll Luna, MD;  Location: [facility];  Service: General;  Laterality: Right;   CHOLECYSTECTOMY     s/p arthroscopic right knee sugury  10/09   Dr. Eddie Dibbles    SOCIAL HISTORY: Social History   Socioeconomic History   Marital status: Married    Spouse name: Conley Canal   Number of children: 2   Years of education: Not on file   Highest education level: Not on file  Occupational History   Occupation: DRIVER    Employer: [employer]  Tobacco Use   Smoking status: Never   Smokeless tobacco: Never  Vaping Use   Vaping Use: Never used  Substance and Sexual Activity   Alcohol use: No   Drug use: No   Sexual activity: Yes  Other Topics Concern   Not on file  Social History Narrative   Not on file   Social Determinants of Health   Financial Resource Strain: Low Risk  (09/09/2022)   Overall Financial Resource Strain (CARDIA)    Difficulty of Paying Living Expenses: Not very hard  Food Insecurity: No Food Insecurity (10/19/2022)   Hunger Vital Sign    Worried About Running Out of Food in the Last Year: Never true    Ran Out of Food in the Last Year: Never true  Transportation  Needs: No Transportation Needs (09/09/2022)   PRAPARE - Hydrologist (Medical): No    Lack of Transportation (Non-Medical): No  Physical Activity: Not on file  Stress: Not on file  Social Connections: Not on file  Intimate Partner Violence: Not At Risk (10/19/2022)   Humiliation, Afraid, Rape, and Kick questionnaire    Fear of Current or Ex-Partner: No    Emotionally Abused: No    Physically Abused: No     Sexually Abused: No    FAMILY HISTORY: Family History  Problem Relation Age of Onset   Arthritis Mother        OA   Diabetes Mother    Hypertension Mother    Heart disease Mother    Hypertension Father    Stroke Father    Diabetes Father    Arthritis Father        RA   Breast cancer Sister        dx <50; mat half sister   Prostate cancer Brother 49       mat half brother   Breast cancer Cousin        mat female cousin    ALLERGIES:  is allergic to aspirin.  MEDICATIONS:  Current Outpatient Medications  Medication Sig Dispense Refill   [START ON 12/15/2022] anastrozole (ARIMIDEX) 1 MG tablet Take 1 tablet (1 mg total) by mouth daily. 90 tablet 3   allopurinol (ZYLOPRIM) 100 MG tablet Take 1 tablet (100 mg total) by mouth daily. 90 tablet 1   allopurinol (ZYLOPRIM) 100 MG tablet Take 1 tablet (100 mg total) by mouth daily 90 tablet 2   ferrous sulfate 325 (65 FE) MG EC tablet Take 325 mg by mouth every other day.     lisinopril (PRINIVIL,ZESTRIL) 20 MG tablet TAKE ONE TABLET BY MOUTH EVERY DAY 90 tablet 2   lisinopril-hydrochlorothiazide (ZESTORETIC) 20-12.5 MG tablet Take 1 tablet by mouth daily. (Patient not taking: Reported on 09/09/2022) 90 tablet 3   lisinopril-hydrochlorothiazide (ZESTORETIC) 20-12.5 MG tablet Take 1 tablet by mouth daily. 90 tablet 1   oxyCODONE (OXY IR/ROXICODONE) 5 MG immediate release tablet Take 1 tablet (5 mg total) by mouth every 6 (six) hours as needed for severe pain. 15 tablet 0   potassium chloride SA (KLOR-CON) 20 MEQ tablet Take 1 tablet (20 mEq total) by mouth daily with food (Patient not taking: Reported on 09/09/2022) 5 tablet 0   Vitamin D, Ergocalciferol, (DRISDOL) 1.25 MG (50000 UNIT) CAPS capsule Take 1 capsule (50,000 Units total) by mouth once a week 12 capsule 1   No current facility-administered medications for this visit.    REVIEW OF SYSTEMS:   Constitutional: Denies fevers, chills or abnormal night sweats Eyes: Denies blurriness  of vision, double vision or watery eyes Ears, nose, mouth, throat, and face: Denies mucositis or sore throat Respiratory: Denies cough, dyspnea or wheezes Cardiovascular: Denies palpitation, chest discomfort or lower extremity swelling Gastrointestinal:  Denies nausea, heartburn or change in bowel habits Skin: Denies abnormal skin rashes Lymphatics: Denies new lymphadenopathy or easy bruising Neurological:Denies numbness, tingling or new weaknesses Behavioral/Psych: Mood is stable, no new changes  Breast: Denies any palpable lumps or discharge All other systems were reviewed with the patient and are negative.  PHYSICAL EXAMINATION: ECOG PERFORMANCE STATUS: 0 - Asymptomatic  Vitals:   11/08/22 0835  BP: (!) 140/63  Pulse: 80  Resp: 18  Temp: 98.1 F (36.7 C)  SpO2: 100%   Filed Weights   11/08/22 0835  Weight: 180 lb 9.6 oz (81.9 kg)    GENERAL:alert, no distress and comfortable   LABORATORY DATA:  I have reviewed the data as listed Lab Results  Component Value Date   WBC 3.5 (L) 11/03/2022   HGB 11.7 (L) 11/03/2022   HCT 36.3 11/03/2022   MCV 75.3 (L) 11/03/2022   PLT 340 11/03/2022   Lab Results  Component Value Date   NA 140 09/14/2022   K 3.6 09/14/2022   CL 105 09/14/2022   CO2 26 09/14/2022    RADIOGRAPHIC STUDIES: I have personally reviewed the radiological reports and agreed with the findings in the report.  ASSESSMENT AND PLAN:  DCIS (ductal carcinoma in situ) This is a very pleasant 62 year old postmenopausal female patient with newly diagnosed right breast DCIS referred to medical oncology for recommendations.  She is now status postlumpectomy and is undergoing adjuvant radiation.  She anticipates radiation to be done around mid April.  She is tolerating it well so far.  After completing radiation, she will start antiestrogen therapy.  We have once again discussed about tamoxifen versus aromatase inhibitors.  She is interested in trying aromatase  inhibitors.  We have discussed about mechanism of action of aromatase inhibitors, adverse effects of aromatase inhibitors including but not limited to vaginal dryness, arthralgias, hot flashes, bone density loss etc.  She is willing to try this.  Prescription has been dispensed to the pharmacy of her choice.  She was clearly instructed to start it in the first week of May.  I will also order a baseline bone density.  She will return to clinic in August for survivorship visit and toxicity check.   Total time spent: 20 minutes including history, physical exam, review of records, counseling and coordination of care All questions were answered. The patient knows to call the clinic with any problems, questions or concerns.    Benay Pike, MD 11/08/22

## 2022-11-08 NOTE — Telephone Encounter (Signed)
Spoke with patient confirming next upcoming appointment

## 2022-11-09 ENCOUNTER — Ambulatory Visit
Admission: RE | Admit: 2022-11-09 | Discharge: 2022-11-09 | Disposition: A | Discharge status: Home / Self Care | Payer: Commercial Managed Care - PPO | Source: Ambulatory Visit | Attending: Radiation Oncology | Admitting: Radiation Oncology

## 2022-11-09 ENCOUNTER — Other Ambulatory Visit: Payer: Self-pay

## 2022-11-09 DIAGNOSIS — Z510 Encounter for antineoplastic radiation therapy: Secondary | ICD-10-CM | POA: Diagnosis not present

## 2022-11-09 DIAGNOSIS — Z170 Estrogen receptor positive status [ER+]: Secondary | ICD-10-CM | POA: Diagnosis not present

## 2022-11-09 DIAGNOSIS — D0511 Intraductal carcinoma in situ of right breast: Secondary | ICD-10-CM | POA: Diagnosis not present

## 2022-11-09 LAB — RAD ONC ARIA SESSION SUMMARY
Course Elapsed Days: 7
Plan Fractions Treated to Date: 6
Plan Prescribed Dose Per Fraction: 2.66 Gy
Plan Total Fractions Prescribed: 16
Plan Total Prescribed Dose: 42.56 Gy
Reference Point Dosage Given to Date: 15.96 Gy
Reference Point Session Dosage Given: 2.66 Gy
Session Number: 6

## 2022-11-10 ENCOUNTER — Other Ambulatory Visit: Payer: Self-pay

## 2022-11-10 ENCOUNTER — Ambulatory Visit
Admission: RE | Admit: 2022-11-10 | Discharge: 2022-11-10 | Disposition: A | Discharge status: Home / Self Care | Payer: Commercial Managed Care - PPO | Source: Ambulatory Visit | Attending: Radiation Oncology | Admitting: Radiation Oncology

## 2022-11-10 DIAGNOSIS — Z170 Estrogen receptor positive status [ER+]: Secondary | ICD-10-CM | POA: Diagnosis not present

## 2022-11-10 DIAGNOSIS — D0511 Intraductal carcinoma in situ of right breast: Secondary | ICD-10-CM | POA: Diagnosis not present

## 2022-11-10 DIAGNOSIS — Z510 Encounter for antineoplastic radiation therapy: Secondary | ICD-10-CM | POA: Diagnosis not present

## 2022-11-10 LAB — RAD ONC ARIA SESSION SUMMARY
Course Elapsed Days: 8
Plan Fractions Treated to Date: 7
Plan Prescribed Dose Per Fraction: 2.66 Gy
Plan Total Fractions Prescribed: 16
Plan Total Prescribed Dose: 42.56 Gy
Reference Point Dosage Given to Date: 18.62 Gy
Reference Point Session Dosage Given: 2.66 Gy
Session Number: 7

## 2022-11-11 ENCOUNTER — Other Ambulatory Visit: Payer: Self-pay

## 2022-11-11 ENCOUNTER — Ambulatory Visit
Admission: RE | Admit: 2022-11-11 | Discharge: 2022-11-11 | Disposition: A | Discharge status: Home / Self Care | Payer: Commercial Managed Care - PPO | Source: Ambulatory Visit | Attending: Radiation Oncology | Admitting: Radiation Oncology

## 2022-11-11 DIAGNOSIS — Z170 Estrogen receptor positive status [ER+]: Secondary | ICD-10-CM | POA: Diagnosis not present

## 2022-11-11 DIAGNOSIS — Z510 Encounter for antineoplastic radiation therapy: Secondary | ICD-10-CM | POA: Diagnosis not present

## 2022-11-11 DIAGNOSIS — D0511 Intraductal carcinoma in situ of right breast: Secondary | ICD-10-CM | POA: Diagnosis not present

## 2022-11-11 LAB — RAD ONC ARIA SESSION SUMMARY
Course Elapsed Days: 9
Plan Fractions Treated to Date: 8
Plan Prescribed Dose Per Fraction: 2.66 Gy
Plan Total Fractions Prescribed: 16
Plan Total Prescribed Dose: 42.56 Gy
Reference Point Dosage Given to Date: 21.28 Gy
Reference Point Session Dosage Given: 2.66 Gy
Session Number: 8

## 2022-11-12 ENCOUNTER — Other Ambulatory Visit: Payer: Self-pay

## 2022-11-12 ENCOUNTER — Ambulatory Visit
Admission: RE | Admit: 2022-11-12 | Discharge: 2022-11-12 | Disposition: A | Discharge status: Home / Self Care | Payer: Commercial Managed Care - PPO | Source: Ambulatory Visit | Attending: Radiation Oncology | Admitting: Radiation Oncology

## 2022-11-12 DIAGNOSIS — Z510 Encounter for antineoplastic radiation therapy: Secondary | ICD-10-CM | POA: Diagnosis not present

## 2022-11-12 DIAGNOSIS — Z170 Estrogen receptor positive status [ER+]: Secondary | ICD-10-CM | POA: Diagnosis not present

## 2022-11-12 DIAGNOSIS — D0511 Intraductal carcinoma in situ of right breast: Secondary | ICD-10-CM | POA: Diagnosis not present

## 2022-11-12 LAB — RAD ONC ARIA SESSION SUMMARY
Course Elapsed Days: 10
Plan Fractions Treated to Date: 9
Plan Prescribed Dose Per Fraction: 2.66 Gy
Plan Total Fractions Prescribed: 16
Plan Total Prescribed Dose: 42.56 Gy
Reference Point Dosage Given to Date: 23.94 Gy
Reference Point Session Dosage Given: 2.66 Gy
Session Number: 9

## 2022-11-15 ENCOUNTER — Ambulatory Visit
Admission: RE | Admit: 2022-11-15 | Discharge: 2022-11-15 | Disposition: A | Discharge status: Home / Self Care | Payer: Commercial Managed Care - PPO | Source: Ambulatory Visit | Attending: Radiation Oncology | Admitting: Radiation Oncology

## 2022-11-15 ENCOUNTER — Other Ambulatory Visit: Payer: Self-pay

## 2022-11-15 DIAGNOSIS — D0511 Intraductal carcinoma in situ of right breast: Secondary | ICD-10-CM | POA: Diagnosis not present

## 2022-11-15 DIAGNOSIS — Z510 Encounter for antineoplastic radiation therapy: Secondary | ICD-10-CM | POA: Diagnosis not present

## 2022-11-15 DIAGNOSIS — Z170 Estrogen receptor positive status [ER+]: Secondary | ICD-10-CM | POA: Diagnosis not present

## 2022-11-15 LAB — RAD ONC ARIA SESSION SUMMARY
Course Elapsed Days: 13
Plan Fractions Treated to Date: 10
Plan Prescribed Dose Per Fraction: 2.66 Gy
Plan Total Fractions Prescribed: 16
Plan Total Prescribed Dose: 42.56 Gy
Reference Point Dosage Given to Date: 26.6 Gy
Reference Point Session Dosage Given: 2.66 Gy
Session Number: 10

## 2022-11-16 ENCOUNTER — Other Ambulatory Visit: Payer: Self-pay

## 2022-11-16 ENCOUNTER — Ambulatory Visit
Admission: RE | Admit: 2022-11-16 | Discharge: 2022-11-16 | Disposition: A | Discharge status: Home / Self Care | Payer: Commercial Managed Care - PPO | Source: Ambulatory Visit | Attending: Radiation Oncology | Admitting: Radiation Oncology

## 2022-11-16 DIAGNOSIS — Z170 Estrogen receptor positive status [ER+]: Secondary | ICD-10-CM | POA: Diagnosis not present

## 2022-11-16 DIAGNOSIS — D0511 Intraductal carcinoma in situ of right breast: Secondary | ICD-10-CM | POA: Diagnosis not present

## 2022-11-16 DIAGNOSIS — Z510 Encounter for antineoplastic radiation therapy: Secondary | ICD-10-CM | POA: Diagnosis not present

## 2022-11-16 LAB — RAD ONC ARIA SESSION SUMMARY
Course Elapsed Days: 14
Plan Fractions Treated to Date: 11
Plan Prescribed Dose Per Fraction: 2.66 Gy
Plan Total Fractions Prescribed: 16
Plan Total Prescribed Dose: 42.56 Gy
Reference Point Dosage Given to Date: 29.26 Gy
Reference Point Session Dosage Given: 2.66 Gy
Session Number: 11

## 2022-11-17 ENCOUNTER — Ambulatory Visit
Admission: RE | Admit: 2022-11-17 | Discharge: 2022-11-17 | Disposition: A | Discharge status: Home / Self Care | Payer: Commercial Managed Care - PPO | Source: Ambulatory Visit | Attending: Radiation Oncology | Admitting: Radiation Oncology

## 2022-11-17 ENCOUNTER — Other Ambulatory Visit: Payer: Self-pay

## 2022-11-17 DIAGNOSIS — Z170 Estrogen receptor positive status [ER+]: Secondary | ICD-10-CM | POA: Diagnosis not present

## 2022-11-17 DIAGNOSIS — D0511 Intraductal carcinoma in situ of right breast: Secondary | ICD-10-CM | POA: Diagnosis not present

## 2022-11-17 DIAGNOSIS — Z510 Encounter for antineoplastic radiation therapy: Secondary | ICD-10-CM | POA: Diagnosis not present

## 2022-11-17 LAB — RAD ONC ARIA SESSION SUMMARY
Course Elapsed Days: 15
Plan Fractions Treated to Date: 12
Plan Prescribed Dose Per Fraction: 2.66 Gy
Plan Total Fractions Prescribed: 16
Plan Total Prescribed Dose: 42.56 Gy
Reference Point Dosage Given to Date: 31.92 Gy
Reference Point Session Dosage Given: 2.66 Gy
Session Number: 12

## 2022-11-18 ENCOUNTER — Other Ambulatory Visit: Payer: Self-pay

## 2022-11-18 ENCOUNTER — Ambulatory Visit
Admission: RE | Admit: 2022-11-18 | Discharge: 2022-11-18 | Disposition: A | Discharge status: Home / Self Care | Payer: Commercial Managed Care - PPO | Source: Ambulatory Visit | Attending: Radiation Oncology | Admitting: Radiation Oncology

## 2022-11-18 DIAGNOSIS — Z170 Estrogen receptor positive status [ER+]: Secondary | ICD-10-CM | POA: Diagnosis not present

## 2022-11-18 DIAGNOSIS — Z510 Encounter for antineoplastic radiation therapy: Secondary | ICD-10-CM | POA: Diagnosis not present

## 2022-11-18 DIAGNOSIS — D0511 Intraductal carcinoma in situ of right breast: Secondary | ICD-10-CM | POA: Diagnosis not present

## 2022-11-18 LAB — RAD ONC ARIA SESSION SUMMARY
Course Elapsed Days: 16
Plan Fractions Treated to Date: 13
Plan Prescribed Dose Per Fraction: 2.66 Gy
Plan Total Fractions Prescribed: 16
Plan Total Prescribed Dose: 42.56 Gy
Reference Point Dosage Given to Date: 34.58 Gy
Reference Point Session Dosage Given: 2.66 Gy
Session Number: 13

## 2022-11-19 ENCOUNTER — Ambulatory Visit
Admission: RE | Admit: 2022-11-19 | Discharge: 2022-11-19 | Disposition: A | Discharge status: Home / Self Care | Payer: Commercial Managed Care - PPO | Source: Ambulatory Visit | Attending: Radiation Oncology | Admitting: Radiation Oncology

## 2022-11-19 ENCOUNTER — Ambulatory Visit: Payer: Commercial Managed Care - PPO | Admitting: Radiation Oncology

## 2022-11-19 ENCOUNTER — Other Ambulatory Visit: Payer: Self-pay

## 2022-11-19 DIAGNOSIS — D0511 Intraductal carcinoma in situ of right breast: Secondary | ICD-10-CM

## 2022-11-19 DIAGNOSIS — Z170 Estrogen receptor positive status [ER+]: Secondary | ICD-10-CM | POA: Diagnosis not present

## 2022-11-19 DIAGNOSIS — Z510 Encounter for antineoplastic radiation therapy: Secondary | ICD-10-CM | POA: Diagnosis not present

## 2022-11-19 LAB — RAD ONC ARIA SESSION SUMMARY
Course Elapsed Days: 17
Plan Fractions Treated to Date: 14
Plan Prescribed Dose Per Fraction: 2.66 Gy
Plan Total Fractions Prescribed: 16
Plan Total Prescribed Dose: 42.56 Gy
Reference Point Dosage Given to Date: 37.24 Gy
Reference Point Session Dosage Given: 2.66 Gy
Session Number: 14

## 2022-11-19 MED ORDER — RADIAPLEXRX EX GEL
Freq: Once | CUTANEOUS | Status: AC
  Administered 2022-11-19: 1 via TOPICAL

## 2022-11-22 ENCOUNTER — Ambulatory Visit: Payer: Commercial Managed Care - PPO | Admitting: Radiation Oncology

## 2022-11-22 ENCOUNTER — Other Ambulatory Visit: Payer: Self-pay

## 2022-11-22 ENCOUNTER — Ambulatory Visit
Admission: RE | Admit: 2022-11-22 | Discharge: 2022-11-22 | Disposition: A | Discharge status: Home / Self Care | Payer: Commercial Managed Care - PPO | Source: Ambulatory Visit | Attending: Radiation Oncology | Admitting: Radiation Oncology

## 2022-11-22 DIAGNOSIS — Z510 Encounter for antineoplastic radiation therapy: Secondary | ICD-10-CM | POA: Diagnosis not present

## 2022-11-22 DIAGNOSIS — Z170 Estrogen receptor positive status [ER+]: Secondary | ICD-10-CM | POA: Diagnosis not present

## 2022-11-22 DIAGNOSIS — D0511 Intraductal carcinoma in situ of right breast: Secondary | ICD-10-CM | POA: Diagnosis not present

## 2022-11-22 LAB — RAD ONC ARIA SESSION SUMMARY
Course Elapsed Days: 20
Plan Fractions Treated to Date: 15
Plan Prescribed Dose Per Fraction: 2.66 Gy
Plan Total Fractions Prescribed: 16
Plan Total Prescribed Dose: 42.56 Gy
Reference Point Dosage Given to Date: 39.9 Gy
Reference Point Session Dosage Given: 2.66 Gy
Session Number: 15

## 2022-11-23 ENCOUNTER — Telehealth: Payer: Self-pay | Admitting: Genetic Counselor

## 2022-11-23 ENCOUNTER — Ambulatory Visit
Admission: RE | Admit: 2022-11-23 | Discharge: 2022-11-23 | Disposition: A | Discharge status: Home / Self Care | Payer: Commercial Managed Care - PPO | Source: Ambulatory Visit | Attending: Radiation Oncology | Admitting: Radiation Oncology

## 2022-11-23 ENCOUNTER — Ambulatory Visit: Payer: Commercial Managed Care - PPO | Admitting: Radiation Oncology

## 2022-11-23 ENCOUNTER — Ambulatory Visit: Payer: Self-pay | Admitting: Genetic Counselor

## 2022-11-23 ENCOUNTER — Encounter: Payer: Self-pay | Admitting: Genetic Counselor

## 2022-11-23 ENCOUNTER — Other Ambulatory Visit: Payer: Self-pay

## 2022-11-23 DIAGNOSIS — Z170 Estrogen receptor positive status [ER+]: Secondary | ICD-10-CM | POA: Diagnosis not present

## 2022-11-23 DIAGNOSIS — Z510 Encounter for antineoplastic radiation therapy: Secondary | ICD-10-CM | POA: Diagnosis not present

## 2022-11-23 DIAGNOSIS — Z803 Family history of malignant neoplasm of breast: Secondary | ICD-10-CM

## 2022-11-23 DIAGNOSIS — D0511 Intraductal carcinoma in situ of right breast: Secondary | ICD-10-CM

## 2022-11-23 DIAGNOSIS — Z8042 Family history of malignant neoplasm of prostate: Secondary | ICD-10-CM

## 2022-11-23 DIAGNOSIS — Z1379 Encounter for other screening for genetic and chromosomal anomalies: Secondary | ICD-10-CM

## 2022-11-23 LAB — RAD ONC ARIA SESSION SUMMARY
Course Elapsed Days: 21
Plan Fractions Treated to Date: 16
Plan Prescribed Dose Per Fraction: 2.66 Gy
Plan Total Fractions Prescribed: 16
Plan Total Prescribed Dose: 42.56 Gy
Reference Point Dosage Given to Date: 42.56 Gy
Reference Point Session Dosage Given: 2.66 Gy
Session Number: 16

## 2022-11-23 NOTE — Telephone Encounter (Signed)
Revealed negative genetics.   

## 2022-11-23 NOTE — Progress Notes (Signed)
HPI:   [name] was previously seen in the [facility] due to a personal history of breast cancer and concerns regarding a hereditary predisposition to cancer. Please refer to our prior cancer genetics clinic note for more information regarding our discussion, assessment and recommendations, at the time. [name]'s recent genetic test results were disclosed to her, as were recommendations warranted by these results. These results and recommendations are discussed in more detail below.  CANCER HISTORY:  Oncology History  DCIS (ductal carcinoma in situ)  07/13/2022 Mammogram   Bilateral screening mammogram showed calcifications in the right breast.  Unilateral diagnostic mammogram showed suspicious right breast calcifications.   08/11/2022 Pathology Results   Right breast needle core biopsy showed intermediate grade DCIS, ER/PR +100% and 95%, strong staining intensity   08/30/2022 Initial Diagnosis   DCIS (ductal carcinoma in situ)   11/15/2022 Genetic Testing   Negative [facility] Common Hereditary Cancers +RNA Panel.  Report date is 11/15/2022.    The [facility] Common Hereditary Cancers + RNA Panel includes sequencing, deletion/duplication, and RNA analysis of the following 48 genes: APC, ATM, AXIN2, BAP1, BARD1, BMPR1A, BRCA1, BRCA2, BRIP1, CDH1, CDK4*, CDKN2A*, CHEK2, CTNNA1, DICER1, EPCAM* (del/dup only), FH, GREM1* (promoter dup analysis only), HOXB13*, KIT*, MBD4*, MEN1, MLH1, MSH2, MSH3, MSH6, MUTYH, NF1, NTHL1, PALB2, PDGFRA*, PMS2, POLD1, POLE, PTEN, RAD51C, RAD51D, SDHA (sequencing only), SDHB, SDHC, SDHD, SMAD4, SMARCA4, STK11, TP53, TSC1, TSC2, VHL.  *Genes without RNA analysis.         FAMILY HISTORY:  We obtained a detailed, 4-generation family history.  Significant diagnoses are listed below:      Family History  Problem Relation Age of Onset   Breast cancer Sister          dx <50; mat half sister   Prostate cancer Brother 82        mat half brother    Breast cancer Cousin          mat female cousin       [name] is unaware of previous family history of genetic testing for hereditary cancer risks. There is no reported Ashkenazi Jewish ancestry. There is no known consanguinity.    GENETIC TEST RESULTS:  The [facility] Common Hereditary Cancers +RNA Panel found no pathogenic mutations.    The [facility] Common Hereditary Cancers + RNA Panel includes sequencing, deletion/duplication, and RNA analysis of the following 48 genes: APC, ATM, AXIN2, BAP1, BARD1, BMPR1A, BRCA1, BRCA2, BRIP1, CDH1, CDK4*, CDKN2A*, CHEK2, CTNNA1, DICER1, EPCAM* (del/dup only), FH, GREM1* (promoter dup analysis only), HOXB13*, KIT*, MBD4*, MEN1, MLH1, MSH2, MSH3, MSH6, MUTYH, NF1, NTHL1, PALB2, PDGFRA*, PMS2, POLD1, POLE, PTEN, RAD51C, RAD51D, SDHA (sequencing only), SDHB, SDHC, SDHD, SMAD4, SMARCA4, STK11, TP53, TSC1, TSC2, VHL.  *Genes without RNA analysis.   The test report has been scanned into EPIC and is located under the Molecular Pathology section of the Results Review tab.  A portion of the result report is included below for reference. Genetic testing reported out on November 15, 2022.      Even though a pathogenic variant was not identified, possible explanations for the cancer in the family may include: There may be no hereditary risk for cancer in the family. The cancers in [name] and/or her family may be sporadic/familial or due to other genetic and environmental factors. There may be a gene mutation in one of these genes that current testing methods cannot detect but that chance is small. There could be another gene that has  not yet been discovered, or that we have not yet tested, that is responsible for the cancer diagnoses in the family.  It is also possible there is a hereditary cause for the cancer in the family that [name] did not inherit.   Therefore, it is important to remain in touch with cancer genetics in the future so that we can continue to  offer [name] the most up to date genetic testing.     ADDITIONAL GENETIC TESTING:  We discussed with [name] that her genetic testing was fairly extensive.  If there are additional relevant genes identified to increase cancer risk that can be analyzed in the future, we would be happy to discuss and coordinate this testing at that time.     CANCER SCREENING RECOMMENDATIONS:  [name]'s test result is considered negative (normal).  This means that we have not identified a hereditary cause for her personal history of DCIS at this time.   An individual's cancer risk and medical management are not determined by genetic test results alone. Overall cancer risk assessment incorporates additional factors, including personal medical history, family history, and any available genetic information that may result in a personalized plan for cancer prevention and surveillance. Therefore, it is recommended she continue to follow the cancer management and screening guidelines provided by her oncology and primary healthcare provider.  RECOMMENDATIONS FOR FAMILY MEMBERS:   Since she did not inherit a identifiable mutation in a cancer predisposition gene included on this panel, her children could not have inherited a known mutation from her in one of these genes. Individuals in this family might be at some increased risk of developing cancer, over the general population risk, due to the family history of cancer.  Individuals in the family should notify their providers of the family history of cancer. We recommend women in this family have a yearly mammogram beginning at age 61, or 39 years younger than the earliest onset of cancer, an annual clinical breast exam, and perform monthly breast self-exams.   Other members of the family may still carry a pathogenic variant in one of these genes that [name] did not inherit. Based on the family history, we recommend her maternal half sister, who was diagnosed with  breast cancer before age 36, have genetic counseling and testing. [name] will let us know if we can be of any assistance in coordinating genetic counseling and/or testing for this family member.     FOLLOW-UP:  Lastly, we discussed with [name] that cancer genetics is a rapidly advancing field and it is possible that new genetic tests will be appropriate for her and/or her family members in the future. We encouraged her to remain in contact with cancer genetics on an annual basis so we can update her personal and family histories and let her know of advances in cancer genetics that may benefit this family.   Our contact number was provided. [name]. Gouger's questions were answered to her satisfaction, and she knows she is welcome to call us at anytime with additional questions or concerns.   [name] M. Rennie Plowman, [name], [facility] Genetic Counselor [name] (P) [phone]

## 2022-11-24 ENCOUNTER — Ambulatory Visit
Admission: RE | Admit: 2022-11-24 | Discharge: 2022-11-24 | Disposition: A | Discharge status: Home / Self Care | Payer: Commercial Managed Care - PPO | Source: Ambulatory Visit | Attending: Radiation Oncology | Admitting: Radiation Oncology

## 2022-11-24 ENCOUNTER — Other Ambulatory Visit: Payer: Self-pay

## 2022-11-24 DIAGNOSIS — Z170 Estrogen receptor positive status [ER+]: Secondary | ICD-10-CM | POA: Diagnosis not present

## 2022-11-24 DIAGNOSIS — Z510 Encounter for antineoplastic radiation therapy: Secondary | ICD-10-CM | POA: Diagnosis not present

## 2022-11-24 DIAGNOSIS — D0511 Intraductal carcinoma in situ of right breast: Secondary | ICD-10-CM | POA: Diagnosis not present

## 2022-11-24 LAB — RAD ONC ARIA SESSION SUMMARY
Course Elapsed Days: 22
Plan Fractions Treated to Date: 1
Plan Prescribed Dose Per Fraction: 2 Gy
Plan Total Fractions Prescribed: 4
Plan Total Prescribed Dose: 8 Gy
Reference Point Dosage Given to Date: 2 Gy
Reference Point Session Dosage Given: 2 Gy
Session Number: 17

## 2022-11-25 ENCOUNTER — Other Ambulatory Visit: Payer: Self-pay

## 2022-11-25 ENCOUNTER — Ambulatory Visit
Admission: RE | Admit: 2022-11-25 | Discharge: 2022-11-25 | Disposition: A | Discharge status: Home / Self Care | Payer: Commercial Managed Care - PPO | Source: Ambulatory Visit | Attending: Radiation Oncology | Admitting: Radiation Oncology

## 2022-11-25 DIAGNOSIS — D0511 Intraductal carcinoma in situ of right breast: Secondary | ICD-10-CM | POA: Diagnosis not present

## 2022-11-25 LAB — RAD ONC ARIA SESSION SUMMARY
Course Elapsed Days: 23
Plan Fractions Treated to Date: 2
Plan Prescribed Dose Per Fraction: 2 Gy
Plan Total Fractions Prescribed: 4
Plan Total Prescribed Dose: 8 Gy
Reference Point Dosage Given to Date: 4 Gy
Reference Point Session Dosage Given: 2 Gy
Session Number: 18

## 2022-11-26 ENCOUNTER — Ambulatory Visit
Admission: RE | Admit: 2022-11-26 | Discharge: 2022-11-26 | Disposition: A | Discharge status: Home / Self Care | Payer: Commercial Managed Care - PPO | Source: Ambulatory Visit | Attending: Radiation Oncology | Admitting: Radiation Oncology

## 2022-11-26 ENCOUNTER — Other Ambulatory Visit: Payer: Self-pay

## 2022-11-26 DIAGNOSIS — D0511 Intraductal carcinoma in situ of right breast: Secondary | ICD-10-CM | POA: Diagnosis not present

## 2022-11-26 LAB — RAD ONC ARIA SESSION SUMMARY
Course Elapsed Days: 24
Plan Fractions Treated to Date: 3
Plan Prescribed Dose Per Fraction: 2 Gy
Plan Total Fractions Prescribed: 4
Plan Total Prescribed Dose: 8 Gy
Reference Point Dosage Given to Date: 6 Gy
Reference Point Session Dosage Given: 2 Gy
Session Number: 19

## 2022-11-26 MED ORDER — RADIAPLEXRX EX GEL: Freq: Once | CUTANEOUS | Status: AC

## 2022-11-26 MED ORDER — ALRA NON-METALLIC DEODORANT (RAD-ONC)
1.0000 | Freq: Once | TOPICAL | Status: AC
  Administered 2022-11-26: 1 via TOPICAL

## 2022-11-29 ENCOUNTER — Encounter: Payer: Self-pay | Admitting: *Deleted

## 2022-11-29 ENCOUNTER — Ambulatory Visit
Admission: RE | Admit: 2022-11-29 | Discharge: 2022-11-29 | Disposition: A | Discharge status: Home / Self Care | Payer: Commercial Managed Care - PPO | Source: Ambulatory Visit | Attending: Radiation Oncology | Admitting: Radiation Oncology

## 2022-11-29 ENCOUNTER — Encounter: Payer: Self-pay | Admitting: Radiation Oncology

## 2022-11-29 ENCOUNTER — Other Ambulatory Visit: Payer: Self-pay

## 2022-11-29 DIAGNOSIS — D0511 Intraductal carcinoma in situ of right breast: Secondary | ICD-10-CM | POA: Diagnosis not present

## 2022-11-29 DIAGNOSIS — Z170 Estrogen receptor positive status [ER+]: Secondary | ICD-10-CM | POA: Diagnosis not present

## 2022-11-29 DIAGNOSIS — Z510 Encounter for antineoplastic radiation therapy: Secondary | ICD-10-CM | POA: Diagnosis not present

## 2022-11-29 LAB — RAD ONC ARIA SESSION SUMMARY
Course Elapsed Days: 27
Plan Fractions Treated to Date: 4
Plan Prescribed Dose Per Fraction: 2 Gy
Plan Total Fractions Prescribed: 4
Plan Total Prescribed Dose: 8 Gy
Reference Point Dosage Given to Date: 8 Gy
Reference Point Session Dosage Given: 2 Gy
Session Number: 20

## 2022-11-30 NOTE — Progress Notes (Signed)
  Radiation Oncology         [phone] ________________________________  Name: [name] MRN: [id number]  Date: 11/29/2022  DOB: October 13, 1960  End of Treatment Note  Diagnosis:    Intermediate Grade, ER/PR positive DCIS of the right breast.   Indication for treatment:  Curative       Radiation treatment dates:   11/02/22-11/29/22  Site/dose:   The patient initially received a dose of 42.56 Gy in 16 fractions to the breast using whole-breast tangent fields. This was delivered using a 3-D conformal technique. The patient then received a boost to the seroma. This delivered an additional 8 Gy in 17fractions using a 3 field photon technique due to the depth of the seroma. The total dose was 50.56 Gy.  Narrative: The patient tolerated radiation treatment relatively well. She developed fatigue and anticipated skin changes in the treatment field.   Plan: The patient will receive a call in about one month from the radiation oncology department. She will continue follow up with Dr. Al Pimple as well.      Osker Mason, PAC

## 2022-12-01 NOTE — Radiation Completion Notes (Signed)
Patient Name: [name], [name] MRN: [id number] Date of Birth: 23-May-1961 Referring Physician: Harriette Bouillon, M.D. Date of Service: 2022-12-01 Radiation Oncologist: Dorothy Puffer, M.D. [facility]                             RADIATION ONCOLOGY END OF TREATMENT NOTE     Diagnosis: D05.11 Intraductal carcinoma in situ of right breast Intent: Curative     ==========DELIVERED PLANS==========  First Treatment Date: 2022-11-02 - Last Treatment Date: 2022-11-29   Plan Name: Breast_R Site: Breast, Right Technique: 3D Mode: Photon Dose Per Fraction: 2.66 Gy Prescribed Dose (Delivered / Prescribed): 42.56 Gy / 42.56 Gy Prescribed Fxs (Delivered / Prescribed): 16 / 16   Plan Name: Breast_R_Bst Site: Breast, Right Technique: 3D Mode: Photon Dose Per Fraction: 2 Gy Prescribed Dose (Delivered / Prescribed): 8 Gy / 8 Gy Prescribed Fxs (Delivered / Prescribed): 4 / 4     ==========ON TREATMENT VISIT DATES========== 2022-11-05, 2022-11-12, 2022-11-19, 2022-11-26     ==========UPCOMING VISITS==========       ==========APPENDIX - ON TREATMENT VISIT NOTES==========   See weekly On Treatment Notes is Epic for details.

## 2022-12-14 ENCOUNTER — Other Ambulatory Visit (HOSPITAL_COMMUNITY): Payer: Self-pay

## 2022-12-15 ENCOUNTER — Other Ambulatory Visit (HOSPITAL_COMMUNITY): Payer: Self-pay

## 2022-12-15 MED ORDER — VITAMIN D (ERGOCALCIFEROL) 1.25 MG (50000 UNIT) PO CAPS
50000.0000 [IU] | ORAL_CAPSULE | ORAL | 1 refills | Status: DC
  Filled 2022-12-15: qty 12, 84d supply, fill #0
  Filled 2023-03-04: qty 12, 84d supply, fill #1

## 2023-01-11 ENCOUNTER — Other Ambulatory Visit (HOSPITAL_COMMUNITY): Payer: Self-pay

## 2023-01-11 ENCOUNTER — Other Ambulatory Visit: Payer: Self-pay

## 2023-01-13 ENCOUNTER — Other Ambulatory Visit (HOSPITAL_COMMUNITY): Payer: Self-pay

## 2023-01-13 MED ORDER — LISINOPRIL-HYDROCHLOROTHIAZIDE 20-12.5 MG PO TABS
1.0000 | ORAL_TABLET | Freq: Every day | ORAL | 1 refills | Status: DC
  Filled 2023-01-13: qty 90, 90d supply, fill #0
  Filled 2023-05-26 – 2023-05-27 (×2): qty 90, 90d supply, fill #1

## 2023-01-24 ENCOUNTER — Ambulatory Visit
Admission: RE | Admit: 2023-01-24 | Discharge: 2023-01-24 | Disposition: A | Discharge status: Home / Self Care | Payer: Commercial Managed Care - PPO | Source: Ambulatory Visit | Attending: Hematology and Oncology | Admitting: Hematology and Oncology

## 2023-01-24 NOTE — Progress Notes (Signed)
  Radiation Oncology         [phone] ________________________________  Name: [name] MRN: [id number]  Date of Service: 01/24/2023  DOB: [birth year]  Post Treatment Telephone Note  Diagnosis:  Intermediate Grade, ER/PR positive DCIS of the right breast. (as documented in provider EOT note)  The patient was available for call today.   Symptoms of fatigue have improved since completing therapy.  Symptoms of skin changes have improved since completing therapy.  The patient was encouraged to avoid sun exposure in the area of prior treatment for up to one year following radiation with either sunscreen or by the style of clothing worn in the sun.  The patient has scheduled follow up with her medical oncologist Dr. Al Pimple for ongoing surveillance, and was encouraged to call if she develops concerns or questions regarding radiation.  This concludes the interview.   Ruel Favors, LPN

## 2023-03-01 DIAGNOSIS — D0511 Intraductal carcinoma in situ of right breast: Secondary | ICD-10-CM | POA: Diagnosis not present

## 2023-03-02 DIAGNOSIS — D0511 Intraductal carcinoma in situ of right breast: Secondary | ICD-10-CM | POA: Diagnosis not present

## 2023-03-03 DIAGNOSIS — D0511 Intraductal carcinoma in situ of right breast: Secondary | ICD-10-CM | POA: Diagnosis not present

## 2023-03-07 ENCOUNTER — Other Ambulatory Visit (HOSPITAL_COMMUNITY): Payer: Self-pay

## 2023-03-22 DIAGNOSIS — D0511 Intraductal carcinoma in situ of right breast: Secondary | ICD-10-CM | POA: Diagnosis not present

## 2023-03-25 ENCOUNTER — Telehealth: Payer: Self-pay | Admitting: Adult Health

## 2023-04-06 ENCOUNTER — Encounter: Payer: Commercial Managed Care - PPO | Admitting: Adult Health

## 2023-04-14 ENCOUNTER — Inpatient Hospital Stay: Payer: Commercial Managed Care - PPO | Attending: Adult Health | Admitting: Adult Health

## 2023-04-14 ENCOUNTER — Encounter: Payer: Self-pay | Admitting: Adult Health

## 2023-04-14 VITALS — BP 118/54 | HR 73 | Temp 98.0°F | Resp 18 | Ht 66.0 in | Wt 182.4 lb

## 2023-04-14 DIAGNOSIS — Z170 Estrogen receptor positive status [ER+]: Secondary | ICD-10-CM | POA: Diagnosis not present

## 2023-04-14 DIAGNOSIS — D0511 Intraductal carcinoma in situ of right breast: Secondary | ICD-10-CM | POA: Insufficient documentation

## 2023-04-14 DIAGNOSIS — Z79811 Long term (current) use of aromatase inhibitors: Secondary | ICD-10-CM | POA: Insufficient documentation

## 2023-04-14 DIAGNOSIS — Z923 Personal history of irradiation: Secondary | ICD-10-CM | POA: Insufficient documentation

## 2023-04-14 DIAGNOSIS — Z803 Family history of malignant neoplasm of breast: Secondary | ICD-10-CM | POA: Insufficient documentation

## 2023-04-14 NOTE — Progress Notes (Signed)
SURVIVORSHIP VISIT:  BRIEF ONCOLOGIC HISTORY:  Oncology History  DCIS (ductal carcinoma in situ)  07/13/2022 Mammogram   Bilateral screening mammogram showed calcifications in the right breast.  Unilateral diagnostic mammogram showed suspicious right breast calcifications.   08/11/2022 Pathology Results   Right breast needle core biopsy showed intermediate grade DCIS, ER/PR +100% and 95%, strong staining intensity   09/21/2022 Surgery   Right breast lumpectomy: DCIS, 1.5cm grade 2, margins negative, necrosis present.   09/21/2022 Cancer Staging   Staging form: Breast, AJCC 8th Edition - Pathologic stage from 09/21/2022: Stage 0 (pTis (DCIS), pN0, cM0, ER+, PR+) - Signed by Loa Socks, NP on 04/14/2023 Stage prefix: Initial diagnosis Nuclear grade: G2   11/02/2022 - 11/29/2022 Radiation Therapy   Plan Name: Breast_R Site: Breast, Right Technique: 3D Mode: Photon Dose Per Fraction: 2.66 Gy Prescribed Dose (Delivered / Prescribed): 42.56 Gy / 42.56 Gy Prescribed Fxs (Delivered / Prescribed): 16 / 16   Plan Name: Breast_R_Bst Site: Breast, Right Technique: 3D Mode: Photon Dose Per Fraction: 2 Gy Prescribed Dose (Delivered / Prescribed): 8 Gy / 8 Gy Prescribed Fxs (Delivered / Prescribed): 4 / 4   11/15/2022 Genetic Testing   Negative [facility] Common Hereditary Cancers +RNA Panel.  Report date is 11/15/2022.   Negative [facility] Common Hereditary Cancers +RNA Panel.  Report date is 11/15/2022.    The [facility] Common Hereditary Cancers + RNA Panel includes sequencing, deletion/duplication, and RNA analysis of the following 48 genes: APC, ATM, AXIN2, BAP1, BARD1, BMPR1A, BRCA1, BRCA2, BRIP1, CDH1, CDK4*, CDKN2A*, CHEK2, CTNNA1, DICER1, EPCAM* (del/dup only), FH, GREM1* (promoter dup analysis only), HOXB13*, KIT*, MBD4*, MEN1, MLH1, MSH2, MSH3, MSH6, MUTYH, NF1, NTHL1, PALB2, PDGFRA*, PMS2, POLD1, POLE, PTEN, RAD51C, RAD51D, SDHA (sequencing only), SDHB, SDHC, SDHD, SMAD4, SMARCA4,  STK11, TP53, TSC1, TSC2, VHL.  *Genes without RNA analysis.     12/2022 -  Anti-estrogen oral therapy   Anastrozole daily     INTERVAL HISTORY:  Ms. Marinas to review her survivorship care plan detailing her treatment course for breast cancer, as well as monitoring long-term side effects of that treatment, education regarding health maintenance, screening, and overall wellness and health promotion.     Overall, Ms. Bungert reports feeling quite well.  She is taking anastrozole daily with good tolerance.  She feels some mild fullness in her right breast and continues to apply the RadiaPlexRx gel that was given to her by radiology.  REVIEW OF SYSTEMS:  Review of Systems  Constitutional:  Negative for appetite change, chills, fatigue, fever and unexpected weight change.  HENT:   Negative for hearing loss, lump/mass and trouble swallowing.   Eyes:  Negative for eye problems and icterus.  Respiratory:  Negative for chest tightness, cough and shortness of breath.   Cardiovascular:  Negative for chest pain, leg swelling and palpitations.  Gastrointestinal:  Negative for abdominal distention, abdominal pain, constipation, diarrhea, nausea and vomiting.  Endocrine: Negative for hot flashes.  Genitourinary:  Negative for difficulty urinating.   Musculoskeletal:  Negative for arthralgias.  Skin:  Negative for itching and rash.  Neurological:  Negative for dizziness, extremity weakness, headaches and numbness.  Hematological:  Negative for adenopathy. Does not bruise/bleed easily.  Psychiatric/Behavioral:  Negative for depression. The patient is not nervous/anxious.    Breast: Denies any new nodularity, masses, tenderness, nipple changes, or nipple discharge.       PAST MEDICAL/SURGICAL HISTORY:  Past Medical History:  Diagnosis Date   ANEMIA-IRON DEFICIENCY 01/15/2008   ARTHRITIS 01/15/2008   BACK  PAIN 02/07/2009   CELLULITIS, LEG, LEFT 04/25/2008   GERD 09/24/2010   HEADACHES, HX OF  01/15/2008   HYPERTENSION 01/15/2008   LUMBAR RADICULOPATHY, LEFT 10/20/2010   Prediabetes 11/2016   UTI'S, HX OF 01/15/2008   VITAMIN B12 DEFICIENCY 03/26/2010   Past Surgical History:  Procedure Laterality Date   ABDOMINAL HYSTERECTOMY  1994   fibroids   BILATERAL OOPHORECTOMY     BREAST BIOPSY Right 08/11/2022   MM RT BREAST BX W LOC DEV 1ST LESION IMAGE BX SPEC STEREO GUIDE 08/11/2022 [facility]   BREAST BIOPSY  09/20/2022   MM RT RADIOACTIVE SEED LOC MAMMO GUIDE 09/20/2022 [facility]   BREAST LUMPECTOMY WITH RADIOACTIVE SEED LOCALIZATION Right 09/21/2022   Procedure: RIGHT BREAST LUMPECTOMY WITH RADIOACTIVE SEED LOCALIZATION;  Surgeon: Harriette Bouillon, MD;  Location: [facility] OR;  Service: General;  Laterality: Right;   CHOLECYSTECTOMY     s/p arthroscopic right knee sugury  10/09   Dr. Renae Fickle     ALLERGIES:  Allergies  Allergen Reactions   Aspirin     REACTION: stomach cramps     CURRENT MEDICATIONS:  Outpatient Encounter Medications as of 04/14/2023  Medication Sig   allopurinol (ZYLOPRIM) 100 MG tablet Take 1 tablet (100 mg total) by mouth daily.   anastrozole (ARIMIDEX) 1 MG tablet Take 1 tablet (1 mg total) by mouth daily.   ferrous sulfate 325 (65 FE) MG EC tablet Take 325 mg by mouth every other day.   lisinopril-hydrochlorothiazide (ZESTORETIC) 20-12.5 MG tablet Take 1 tablet by mouth daily.   Vitamin D, Ergocalciferol, (DRISDOL) 1.25 MG (50000 UNIT) CAPS capsule Take 1 capsule (50,000 Units total) by mouth once a week   [DISCONTINUED] allopurinol (ZYLOPRIM) 100 MG tablet Take 1 tablet (100 mg total) by mouth daily (Patient not taking: Reported on 04/14/2023)   [DISCONTINUED] lisinopril (PRINIVIL,ZESTRIL) 20 MG tablet TAKE ONE TABLET BY MOUTH EVERY DAY (Patient not taking: Reported on 04/14/2023)   [DISCONTINUED] lisinopril-hydrochlorothiazide (ZESTORETIC) 20-12.5 MG tablet Take 1 tablet by mouth daily. (Patient not taking: Reported on 04/14/2023)    [DISCONTINUED] oxyCODONE (OXY IR/ROXICODONE) 5 MG immediate release tablet Take 1 tablet (5 mg total) by mouth every 6 (six) hours as needed for severe pain. (Patient not taking: Reported on 04/14/2023)   [DISCONTINUED] potassium chloride SA (KLOR-CON) 20 MEQ tablet Take 1 tablet (20 mEq total) by mouth daily with food (Patient not taking: Reported on 04/14/2023)   No facility-administered encounter medications on file as of 04/14/2023.     ONCOLOGIC FAMILY HISTORY:  Family History  Problem Relation Age of Onset   Arthritis Mother        OA   Diabetes Mother    Hypertension Mother    Heart disease Mother    Hypertension Father    Stroke Father    Diabetes Father    Arthritis Father        RA   Breast cancer Sister        dx <50; mat half sister   Prostate cancer Brother 86       mat half brother   Breast cancer Cousin        mat female cousin     SOCIAL HISTORY:  Social History   Socioeconomic History   Marital status: Married    Spouse name: Lendell Caprice   Number of children: 2   Years of education: Not on file   Highest education level: Not on file  Occupational History   Occupation: DRIVER    Employer: [employer]  Tobacco Use   Smoking status: Never   Smokeless tobacco: Never  Vaping Use   Vaping status: Never Used  Substance and Sexual Activity   Alcohol use: No   Drug use: No   Sexual activity: Yes  Other Topics Concern   Not on file  Social History Narrative   Not on file   Social Determinants of Health   Financial Resource Strain: Low Risk  (09/09/2022)   Overall Financial Resource Strain (CARDIA)    Difficulty of Paying Living Expenses: Not very hard  Food Insecurity: No Food Insecurity (10/19/2022)   Hunger Vital Sign    Worried About Running Out of Food in the Last Year: Never true    Ran Out of Food in the Last Year: Never true  Transportation Needs: No Transportation Needs (09/09/2022)   PRAPARE - Scientist, research (physical sciences) (Medical): No    Lack of Transportation (Non-Medical): No  Physical Activity: Not on file  Stress: Not on file  Social Connections: Not on file  Intimate Partner Violence: Not At Risk (10/19/2022)   Humiliation, Afraid, Rape, and Kick questionnaire    Fear of Current or Ex-Partner: No    Emotionally Abused: No    Physically Abused: No    Sexually Abused: No     OBSERVATIONS/OBJECTIVE:  BP (!) 118/54 (BP Location: Left Arm, Patient Position: Sitting)   Pulse 73   Temp 98 F (36.7 C) (Tympanic)   Resp 18   Ht 5\' 6"  (1.676 m)   Wt 182 lb 6.4 oz (82.7 kg)   SpO2 100%   BMI 29.44 kg/m  GENERAL: Patient is a well appearing female in no acute distress HEENT:  Sclerae anicteric.  Oropharynx clear and moist. No ulcerations or evidence of oropharyngeal candidiasis. Neck is supple.  NODES:  No cervical, supraclavicular, or axillary lymphadenopathy palpated.  BREAST EXAM: Right breast status postlumpectomy and radiation no sign of local recurrence left breast is benign. LUNGS:  Clear to auscultation bilaterally.  No wheezes or rhonchi. HEART:  Regular rate and rhythm. No murmur appreciated. ABDOMEN:  Soft, nontender.  Positive, normoactive bowel sounds. No organomegaly palpated. MSK:  No focal spinal tenderness to palpation. Full range of motion bilaterally in the upper extremities. EXTREMITIES:  No peripheral edema.   SKIN:  Clear with no obvious rashes or skin changes. No nail dyscrasia. NEURO:  Nonfocal. Well oriented.  Appropriate affect.   LABORATORY DATA:  None for this visit.  DIAGNOSTIC IMAGING:  None for this visit.      ASSESSMENT AND PLAN:  Ms.. Gasaway is a pleasant 62 y.o. female with Stage 0 right breast DCIS, ER+/PR+, diagnosed in 08/2022, treated with lumpectomy, adjuvant radiation therapy, and anti-estrogen therapy with Anastrozole beginning in 12/2022.  She presents to the Survivorship Clinic for our initial meeting and routine follow-up post-completion  of treatment for breast cancer.    1. Stage 0 right breast cancer:  Ms. Downes is continuing to recover from definitive treatment for breast cancer. She will follow-up with her medical oncologist, Dr. Al Pimple in 6 months with history and physical exam per surveillance protocol.  She will continue her anti-estrogen therapy with anastrozole. Thus far, she is tolerating the anastrozole well, with minimal side effects. Her mammogram is due November 2024; orders placed today.   Today, a comprehensive survivorship care plan and treatment summary was reviewed with the patient today detailing her breast cancer diagnosis, treatment course, potential late/long-term effects of treatment, appropriate follow-up  care with recommendations for the future, and patient education resources.  A copy of this summary, along with a letter will be sent to the patient's primary care provider via mail/fax/In Basket message after today's visit.    2. Bone health:  Given Ms. Varnum's age/history of breast cancer and her current treatment regimen including anti-estrogen therapy with Anastrozole, she is at risk for bone demineralization.  Has bone density testing scheduled in September 2024.  She was given education on specific activities to promote bone health.  3. Cancer screening:  Due to Ms. Gattuso's history and her age, she should receive screening for skin cancers, colon cancer.  The information and recommendations are listed on the patient's comprehensive care plan/treatment summary and were reviewed in detail with the patient.    4. Health maintenance and wellness promotion: Ms. Zuck was encouraged to consume 5-7 servings of fruits and vegetables per day. We reviewed the "Nutrition Rainbow" handout.  She was also encouraged to engage in moderate to vigorous exercise for 30 minutes per day most days of the week.  She was instructed to limit her alcohol consumption and continue to abstain from tobacco use.     5. Support  services/counseling: It is not uncommon for this period of the patient's cancer care trajectory to be one of many emotions and stressors.   She was given information regarding our available services and encouraged to contact me with any questions or for help enrolling in any of our support group/programs.    Follow up instructions:    -Return to cancer center in 6 months for follow-up with Dr. Al Pimple -Mammogram due in 06/2023 -Bone density testing scheduled in September 2024 -She is welcome to return back to the Survivorship Clinic at any time; no additional follow-up needed at this time.  -Consider referral back to survivorship as a long-term survivor for continued surveillance  The patient was provided an opportunity to ask questions and all were answered. The patient agreed with the plan and demonstrated an understanding of the instructions.   Total encounter time:30 minutes*in face-to-face visit time, chart review, lab review, care coordination, order entry, and documentation of the encounter time.    Lillard Anes, NP 04/14/23 11:00 AM Medical Oncology and Hematology [facility] [address] [place], [place] [address] Tel. [phone]    Fax. [phone]  *Total Encounter Time as defined by the Centers for Medicare and Medicaid Services includes, in addition to the face-to-face time of a patient visit (documented in the note above) non-face-to-face time: obtaining and reviewing outside history, ordering and reviewing medications, tests or procedures, care coordination (communications with other health care professionals or caregivers) and documentation in the medical record.

## 2023-05-10 ENCOUNTER — Other Ambulatory Visit (HOSPITAL_COMMUNITY): Payer: Self-pay

## 2023-05-10 DIAGNOSIS — E790 Hyperuricemia without signs of inflammatory arthritis and tophaceous disease: Secondary | ICD-10-CM | POA: Diagnosis not present

## 2023-05-10 DIAGNOSIS — E559 Vitamin D deficiency, unspecified: Secondary | ICD-10-CM | POA: Diagnosis not present

## 2023-05-10 DIAGNOSIS — Z0000 Encounter for general adult medical examination without abnormal findings: Secondary | ICD-10-CM | POA: Diagnosis not present

## 2023-05-10 DIAGNOSIS — I10 Essential (primary) hypertension: Secondary | ICD-10-CM | POA: Diagnosis not present

## 2023-05-10 DIAGNOSIS — D0511 Intraductal carcinoma in situ of right breast: Secondary | ICD-10-CM | POA: Diagnosis not present

## 2023-05-11 ENCOUNTER — Other Ambulatory Visit: Payer: Self-pay

## 2023-05-11 ENCOUNTER — Other Ambulatory Visit (HOSPITAL_COMMUNITY): Payer: Self-pay

## 2023-05-11 MED ORDER — ALLOPURINOL 100 MG PO TABS
200.0000 mg | ORAL_TABLET | Freq: Every day | ORAL | 1 refills | Status: AC
  Filled 2023-05-11: qty 180, 90d supply, fill #0
  Filled 2023-08-03: qty 180, 90d supply, fill #1

## 2023-05-11 MED ORDER — ALLOPURINOL 100 MG PO TABS
200.0000 mg | ORAL_TABLET | Freq: Every day | ORAL | 0 refills | Status: DC
  Filled 2023-05-11 – 2023-11-01 (×2): qty 180, 90d supply, fill #0

## 2023-05-13 ENCOUNTER — Ambulatory Visit
Admission: RE | Admit: 2023-05-13 | Discharge: 2023-05-13 | Disposition: A | Discharge status: Home / Self Care | Payer: Commercial Managed Care - PPO | Source: Ambulatory Visit | Attending: Hematology and Oncology

## 2023-05-13 DIAGNOSIS — Z90722 Acquired absence of ovaries, bilateral: Secondary | ICD-10-CM | POA: Diagnosis not present

## 2023-05-13 DIAGNOSIS — D0511 Intraductal carcinoma in situ of right breast: Secondary | ICD-10-CM

## 2023-05-13 DIAGNOSIS — Z853 Personal history of malignant neoplasm of breast: Secondary | ICD-10-CM | POA: Diagnosis not present

## 2023-05-13 DIAGNOSIS — N958 Other specified menopausal and perimenopausal disorders: Secondary | ICD-10-CM | POA: Diagnosis not present

## 2023-05-26 ENCOUNTER — Other Ambulatory Visit (HOSPITAL_COMMUNITY): Payer: Self-pay

## 2023-05-26 MED ORDER — VITAMIN D (ERGOCALCIFEROL) 1.25 MG (50000 UNIT) PO CAPS
50000.0000 [IU] | ORAL_CAPSULE | ORAL | 1 refills | Status: DC
  Filled 2023-05-26: qty 12, 84d supply, fill #0
  Filled 2023-08-03 – 2023-08-11 (×2): qty 12, 84d supply, fill #1

## 2023-05-27 ENCOUNTER — Other Ambulatory Visit (HOSPITAL_COMMUNITY): Payer: Self-pay

## 2023-05-27 ENCOUNTER — Other Ambulatory Visit: Payer: Self-pay

## 2023-06-13 ENCOUNTER — Other Ambulatory Visit (HOSPITAL_BASED_OUTPATIENT_CLINIC_OR_DEPARTMENT_OTHER): Payer: Self-pay

## 2023-07-20 ENCOUNTER — Ambulatory Visit
Admission: RE | Admit: 2023-07-20 | Discharge: 2023-07-20 | Disposition: A | Discharge status: Home / Self Care | Payer: Commercial Managed Care - PPO | Source: Ambulatory Visit | Attending: Adult Health | Admitting: Adult Health

## 2023-07-20 DIAGNOSIS — N644 Mastodynia: Secondary | ICD-10-CM | POA: Diagnosis not present

## 2023-07-20 DIAGNOSIS — D0511 Intraductal carcinoma in situ of right breast: Secondary | ICD-10-CM

## 2023-08-02 DIAGNOSIS — E790 Hyperuricemia without signs of inflammatory arthritis and tophaceous disease: Secondary | ICD-10-CM | POA: Diagnosis not present

## 2023-08-03 ENCOUNTER — Other Ambulatory Visit: Payer: Self-pay

## 2023-08-03 ENCOUNTER — Other Ambulatory Visit (HOSPITAL_COMMUNITY): Payer: Self-pay

## 2023-08-03 MED ORDER — LISINOPRIL-HYDROCHLOROTHIAZIDE 20-12.5 MG PO TABS
1.0000 | ORAL_TABLET | Freq: Every day | ORAL | 0 refills | Status: DC
  Filled 2023-08-03 – 2023-08-15 (×2): qty 90, 90d supply, fill #0

## 2023-08-11 ENCOUNTER — Other Ambulatory Visit: Payer: Self-pay

## 2023-08-15 ENCOUNTER — Other Ambulatory Visit (HOSPITAL_COMMUNITY): Payer: Self-pay

## 2023-08-18 DIAGNOSIS — Z23 Encounter for immunization: Secondary | ICD-10-CM | POA: Diagnosis not present

## 2023-10-13 ENCOUNTER — Other Ambulatory Visit: Payer: Self-pay | Admitting: *Deleted

## 2023-10-13 DIAGNOSIS — D0511 Intraductal carcinoma in situ of right breast: Secondary | ICD-10-CM

## 2023-10-13 NOTE — Progress Notes (Unsigned)
 BRIEF ONCOLOGIC HISTORY:  Oncology History  DCIS (ductal carcinoma in situ)  07/13/2022 Mammogram   Bilateral screening mammogram showed calcifications in the right breast.  Unilateral diagnostic mammogram showed suspicious right breast calcifications.   08/11/2022 Pathology Results   Right breast needle core biopsy showed intermediate grade DCIS, ER/PR +100% and 95%, strong staining intensity   09/21/2022 Surgery   Right breast lumpectomy: DCIS, 1.5cm grade 2, margins negative, necrosis present.   09/21/2022 Cancer Staging   Staging form: Breast, AJCC 8th Edition - Pathologic stage from 09/21/2022: Stage 0 (pTis (DCIS), pN0, cM0, ER+, PR+) - Signed by Loa Socks, NP on 04/14/2023 Stage prefix: Initial diagnosis Nuclear grade: G2   11/02/2022 - 11/29/2022 Radiation Therapy   Plan Name: Breast_R Site: Breast, Right Technique: 3D Mode: Photon Dose Per Fraction: 2.66 Gy Prescribed Dose (Delivered / Prescribed): 42.56 Gy / 42.56 Gy Prescribed Fxs (Delivered / Prescribed): 16 / 16   Plan Name: Breast_R_Bst Site: Breast, Right Technique: 3D Mode: Photon Dose Per Fraction: 2 Gy Prescribed Dose (Delivered / Prescribed): 8 Gy / 8 Gy Prescribed Fxs (Delivered / Prescribed): 4 / 4   11/15/2022 Genetic Testing   Negative [facility] Common Hereditary Cancers +RNA Panel.  Report date is 11/15/2022.   Negative [facility] Common Hereditary Cancers +RNA Panel.  Report date is 11/15/2022.    The [facility] Common Hereditary Cancers + RNA Panel includes sequencing, deletion/duplication, and RNA analysis of the following 48 genes: APC, ATM, AXIN2, BAP1, BARD1, BMPR1A, BRCA1, BRCA2, BRIP1, CDH1, CDK4*, CDKN2A*, CHEK2, CTNNA1, DICER1, EPCAM* (del/dup only), FH, GREM1* (promoter dup analysis only), HOXB13*, KIT*, MBD4*, MEN1, MLH1, MSH2, MSH3, MSH6, MUTYH, NF1, NTHL1, PALB2, PDGFRA*, PMS2, POLD1, POLE, PTEN, RAD51C, RAD51D, SDHA (sequencing only), SDHB, SDHC, SDHD, SMAD4, SMARCA4, STK11, TP53, TSC1, TSC2,  VHL.  *Genes without RNA analysis.     12/2022 -  Anti-estrogen oral therapy   Anastrozole daily     INTERVAL HISTORY:   Discussed the use of AI scribe software for clinical note transcription with the patient, who gave verbal consent to proceed.  History of Present Illness    [name] is a 63 year old female with breast cancer who presents for a follow-up visit in the survivorship clinic.  She is currently taking anastrozole without experiencing side effects such as hot flashes or vaginal dryness. No new medications have been added since her last visit.  Her mammogram in November and bone density test in September were both normal. She has not had any other doctor's appointments since her last visit. No changes in her breast have been noted, and she has healed well from radiation therapy. Genetic testing was negative.  She takes iron supplements for iron deficiency, although her iron levels have not been checked since last year. She is also taking vitamin D.  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  No swelling in her legs and no changes in her breast.  REVIEW OF SYSTEMS:  Review of Systems  Constitutional:  Negative for appetite change, chills, fatigue, fever and unexpected weight change.  HENT:   Negative for hearing loss, lump/mass and trouble swallowing.   Eyes:  Negative for eye problems and icterus.  Respiratory:  Negative for chest tightness, cough and shortness of breath.   Cardiovascular:  Negative for chest pain, leg swelling and palpitations.  Gastrointestinal:  Negative for abdominal distention, abdominal pain, constipation, diarrhea, nausea and vomiting.  Endocrine: Negative for hot flashes.  Genitourinary:  Negative for difficulty urinating.   Musculoskeletal:  Negative for arthralgias.  Skin:  Negative for itching and rash.  Neurological:  Negative for  dizziness, extremity weakness, headaches and numbness.  Hematological:  Negative for adenopathy. Does not bruise/bleed easily.  Psychiatric/Behavioral:  Negative for depression. The patient is not nervous/anxious.      PAST MEDICAL/SURGICAL HISTORY:  Past Medical History:  Diagnosis Date   ANEMIA-IRON DEFICIENCY 01/15/2008   ARTHRITIS 01/15/2008   BACK PAIN 02/07/2009   CELLULITIS, LEG, LEFT 04/25/2008   GERD 09/24/2010   HEADACHES, HX OF 01/15/2008   HYPERTENSION 01/15/2008   LUMBAR RADICULOPATHY, LEFT 10/20/2010   Prediabetes 11/2016   UTI'S, HX OF 01/15/2008   VITAMIN B12 DEFICIENCY 03/26/2010   Past Surgical History:  Procedure Laterality Date   ABDOMINAL HYSTERECTOMY  1994   fibroids   BILATERAL OOPHORECTOMY     BREAST BIOPSY Right 08/11/2022   MM RT BREAST BX W LOC DEV 1ST LESION IMAGE BX SPEC STEREO GUIDE 08/11/2022 [facility]   BREAST BIOPSY  09/20/2022   MM RT RADIOACTIVE SEED LOC MAMMO GUIDE 09/20/2022 [facility]   BREAST LUMPECTOMY WITH RADIOACTIVE SEED LOCALIZATION Right 09/21/2022   Procedure: RIGHT BREAST LUMPECTOMY WITH RADIOACTIVE SEED LOCALIZATION;  Surgeon: Harriette Bouillon, MD;  Location: [facility] OR;  Service: General;  Laterality: Right;   CHOLECYSTECTOMY     s/p arthroscopic right knee sugury  10/09   Dr. Renae Fickle     ALLERGIES:  Allergies  Allergen Reactions   Aspirin     REACTION: stomach cramps     CURRENT MEDICATIONS:  Outpatient Encounter Medications as of 10/14/2023  Medication Sig   allopurinol (ZYLOPRIM) 100 MG tablet Take 1 tablet (100 mg total) by mouth daily.   allopurinol (ZYLOPRIM) 100 MG tablet Take 2 tablets (200 mg total) by mouth daily.   allopurinol (ZYLOPRIM) 100 MG tablet Take 2 tablets (200 mg total) by mouth daily.   anastrozole (ARIMIDEX) 1 MG tablet Take 1 tablet (1 mg total) by mouth daily.   ferrous sulfate 325 (65 FE) MG EC tablet Take 325 mg by mouth every other day.   lisinopril-hydrochlorothiazide (ZESTORETIC)  20-12.5 MG tablet Take 1 tablet by mouth daily.   Vitamin D, Ergocalciferol, (DRISDOL) 1.25 MG (50000 UNIT) CAPS capsule Take 1 capsule (50,000 Units total) by mouth once a week.   No facility-administered encounter medications on file as of 10/14/2023.     ONCOLOGIC FAMILY HISTORY:  Family History  Problem Relation Age of Onset   Arthritis Mother        OA   Diabetes Mother    Hypertension Mother    Heart disease Mother    Hypertension Father    Stroke Father    Diabetes Father    Arthritis Father        RA   Breast cancer Sister        dx <50; mat half sister   Prostate cancer Brother 34       mat half brother   Breast cancer Cousin        mat female cousin     SOCIAL HISTORY:  Social History   Socioeconomic History   Marital status: Married    Spouse name: Lendell Caprice   Number of children: 2   Years of education: Not on file   Highest education level: Not on file  Occupational History   Occupation: DRIVER    Employer: [employer]  Tobacco Use  Smoking status: Never   Smokeless tobacco: Never  Vaping Use   Vaping status: Never Used  Substance and Sexual Activity   Alcohol use: No   Drug use: No   Sexual activity: Yes  Other Topics Concern   Not on file  Social History Narrative   Not on file   Social Drivers of Health   Financial Resource Strain: Low Risk  (09/09/2022)   Overall Financial Resource Strain (CARDIA)    Difficulty of Paying Living Expenses: Not very hard  Food Insecurity: No Food Insecurity (10/19/2022)   Hunger Vital Sign    Worried About Running Out of Food in the Last Year: Never true    Ran Out of Food in the Last Year: Never true  Transportation Needs: No Transportation Needs (09/09/2022)   PRAPARE - Administrator, Civil Service (Medical): No    Lack of Transportation (Non-Medical): No  Physical Activity: Not on file  Stress: Not on file  Social Connections: Not on file  Intimate Partner Violence: Not At Risk  (10/19/2022)   Humiliation, Afraid, Rape, and Kick questionnaire    Fear of Current or Ex-Partner: No    Emotionally Abused: No    Physically Abused: No    Sexually Abused: No     OBSERVATIONS/OBJECTIVE:  There were no vitals taken for this visit. GENERAL: Patient is a well appearing female in no acute distress NODES:  No cervical, supraclavicular, or axillary lymphadenopathy palpated.  BREAST EXAM: Right breast status postlumpectomy and radiation no sign of local recurrence, left breast is benign. LUNGS:  Clear to auscultation bilaterally.  No wheezes or rhonchi. HEART:  Regular rate and rhythm. No murmur appreciated. ABDOMEN:  Soft, nontender.  Positive, normoactive bowel sounds. No organomegaly palpated. MSK:  No focal spinal tenderness to palpation. Full range of motion bilaterally in the upper extremities. EXTREMITIES:  No peripheral edema.   SKIN:  Clear with no obvious rashes or skin changes. No nail dyscrasia. NEURO:  Nonfocal. Well oriented.  Appropriate affect.   LABORATORY DATA:  None for this visit.  DIAGNOSTIC IMAGING:  None for this visit.      ASSESSMENT AND PLAN:  Ms.. [name] is a pleasant 63 y.o. female with Stage 0 right breast DCIS, ER+/PR+, diagnosed in 08/2022, treated with lumpectomy, adjuvant radiation therapy, and anti-estrogen therapy with Anastrozole beginning in 12/2022.    Breast Cancer Survivorship Patient is tolerating Anastrozole without reported side effects. No new changes in breasts. Mammogram in November was normal. Genetic testing was negative. -Continue Anastrozole. -Plan to follow up in 6 months if not seeing breast surgeon, or in 1 year if seeing breast surgeon.  Iron Deficiency Patient is taking iron supplements, but iron levels have not been checked recently. -Check iron levels today. -Based on results, will advise on continuation of iron supplementation.  Bone Health Bone density test in September showed strong bones. -Continue  current bone health management.  General Health Maintenance -Continue daily Vitamin D. -Encourage regular exercise. -Check with breast surgeon regarding need for follow-up appointment.  Time spent: 30 min *Total Encounter Time as defined by the Centers for Medicare and Medicaid Services includes, in addition to the face-to-face time of a patient visit (documented in the note above) non-face-to-face time: obtaining and reviewing outside history, ordering and reviewing medications, tests or procedures, care coordination (communications with other health care professionals or caregivers) and documentation in the medical record.

## 2023-10-14 ENCOUNTER — Encounter: Payer: Self-pay | Admitting: Hematology and Oncology

## 2023-10-14 ENCOUNTER — Inpatient Hospital Stay: Payer: Commercial Managed Care - PPO | Attending: Hematology and Oncology

## 2023-10-14 ENCOUNTER — Inpatient Hospital Stay: Payer: Commercial Managed Care - PPO | Admitting: Hematology and Oncology

## 2023-10-14 VITALS — BP 122/57 | HR 80 | Temp 98.3°F | Resp 16 | Wt 175.2 lb

## 2023-10-14 DIAGNOSIS — D0511 Intraductal carcinoma in situ of right breast: Secondary | ICD-10-CM

## 2023-10-14 DIAGNOSIS — Z803 Family history of malignant neoplasm of breast: Secondary | ICD-10-CM | POA: Insufficient documentation

## 2023-10-14 DIAGNOSIS — Z79811 Long term (current) use of aromatase inhibitors: Secondary | ICD-10-CM | POA: Diagnosis not present

## 2023-10-14 DIAGNOSIS — E611 Iron deficiency: Secondary | ICD-10-CM | POA: Insufficient documentation

## 2023-10-14 DIAGNOSIS — Z170 Estrogen receptor positive status [ER+]: Secondary | ICD-10-CM | POA: Diagnosis not present

## 2023-10-14 LAB — CMP (CANCER CENTER ONLY)
ALT: 18 U/L (ref 0–44)
AST: 20 U/L (ref 15–41)
Albumin: 4.4 g/dL (ref 3.5–5.0)
Alkaline Phosphatase: 67 U/L (ref 38–126)
Anion gap: 6 (ref 5–15)
BUN: 23 mg/dL (ref 8–23)
CO2: 29 mmol/L (ref 22–32)
Calcium: 9.7 mg/dL (ref 8.9–10.3)
Chloride: 103 mmol/L (ref 98–111)
Creatinine: 0.93 mg/dL (ref 0.44–1.00)
GFR, Estimated: >60 mL/min (ref >60)
Glucose, Bld: 95 mg/dL (ref 70–99)
Potassium: 3.7 mmol/L (ref 3.5–5.1)
Sodium: 138 mmol/L (ref 135–145)
Total Bilirubin: 0.8 mg/dL (ref 0.0–1.2)
Total Protein: 7.8 g/dL (ref 6.5–8.1)

## 2023-10-14 LAB — CBC WITH DIFFERENTIAL (CANCER CENTER ONLY)
Abs Immature Granulocytes: 0 10*3/uL (ref 0.00–0.07)
Basophils Absolute: 0 10*3/uL (ref 0.0–0.1)
Basophils Relative: 1 %
Eosinophils Absolute: 0.1 10*3/uL (ref 0.0–0.5)
Eosinophils Relative: 2 %
HCT: 36.2 % (ref 36.0–46.0)
Hemoglobin: 11.6 g/dL — ABNORMAL LOW (ref 12.0–15.0)
Immature Granulocytes: 0 %
Lymphocytes Relative: 44 %
Lymphs Abs: 1.7 10*3/uL (ref 0.7–4.0)
MCH: 24.3 pg — ABNORMAL LOW (ref 26.0–34.0)
MCHC: 32 g/dL (ref 30.0–36.0)
MCV: 75.7 fL — ABNORMAL LOW (ref 80.0–100.0)
Monocytes Absolute: 0.4 10*3/uL (ref 0.1–1.0)
Monocytes Relative: 10 %
Neutro Abs: 1.6 10*3/uL — ABNORMAL LOW (ref 1.7–7.7)
Neutrophils Relative %: 43 %
Platelet Count: 294 10*3/uL (ref 150–400)
RBC: 4.78 MIL/uL (ref 3.87–5.11)
RDW: 15 % (ref 11.5–15.5)
WBC Count: 3.8 10*3/uL — ABNORMAL LOW (ref 4.0–10.5)
nRBC: 0 % (ref 0.0–0.2)

## 2023-10-14 LAB — FERRITIN: Ferritin: 319 ng/mL — ABNORMAL HIGH (ref 11–307)

## 2023-10-18 ENCOUNTER — Other Ambulatory Visit (HOSPITAL_COMMUNITY): Payer: Self-pay

## 2023-10-18 MED ORDER — AMOXICILLIN 500 MG PO CAPS
500.0000 mg | ORAL_CAPSULE | Freq: Three times a day (TID) | ORAL | 0 refills | Status: AC
  Filled 2023-10-18: qty 21, 7d supply, fill #0

## 2023-11-01 ENCOUNTER — Other Ambulatory Visit (HOSPITAL_COMMUNITY): Payer: Self-pay

## 2023-11-01 ENCOUNTER — Other Ambulatory Visit: Payer: Self-pay | Admitting: Hematology and Oncology

## 2023-11-01 ENCOUNTER — Encounter (HOSPITAL_COMMUNITY): Payer: Self-pay

## 2023-11-01 ENCOUNTER — Other Ambulatory Visit: Payer: Self-pay

## 2023-11-01 MED ORDER — ANASTROZOLE 1 MG PO TABS
1.0000 mg | ORAL_TABLET | Freq: Every day | ORAL | 3 refills | Status: AC
  Filled 2023-11-01: qty 90, 90d supply, fill #0
  Filled 2024-02-16: qty 90, 90d supply, fill #1
  Filled 2024-05-22: qty 90, 90d supply, fill #2
  Filled 2024-08-16: qty 90, 90d supply, fill #3

## 2023-11-01 MED ORDER — LISINOPRIL-HYDROCHLOROTHIAZIDE 20-12.5 MG PO TABS
1.0000 | ORAL_TABLET | Freq: Every day | ORAL | 1 refills | Status: DC
  Filled 2023-11-01: qty 90, 90d supply, fill #0
  Filled 2024-02-16: qty 90, 90d supply, fill #1

## 2023-11-04 ENCOUNTER — Other Ambulatory Visit (HOSPITAL_COMMUNITY): Payer: Self-pay

## 2023-11-11 ENCOUNTER — Other Ambulatory Visit (HOSPITAL_COMMUNITY): Payer: Self-pay

## 2023-12-05 ENCOUNTER — Other Ambulatory Visit (HOSPITAL_COMMUNITY): Payer: Self-pay

## 2023-12-05 MED ORDER — VITAMIN D (ERGOCALCIFEROL) 1.25 MG (50000 UNIT) PO CAPS
50000.0000 [IU] | ORAL_CAPSULE | ORAL | 1 refills | Status: DC
  Filled 2023-12-05: qty 12, 84d supply, fill #0
  Filled 2024-02-16: qty 12, 84d supply, fill #1

## 2024-01-03 DIAGNOSIS — M79672 Pain in left foot: Secondary | ICD-10-CM | POA: Diagnosis not present

## 2024-01-03 DIAGNOSIS — M79671 Pain in right foot: Secondary | ICD-10-CM | POA: Diagnosis not present

## 2024-01-03 DIAGNOSIS — D225 Melanocytic nevi of trunk: Secondary | ICD-10-CM | POA: Diagnosis not present

## 2024-01-03 DIAGNOSIS — R21 Rash and other nonspecific skin eruption: Secondary | ICD-10-CM | POA: Diagnosis not present

## 2024-02-16 ENCOUNTER — Other Ambulatory Visit (HOSPITAL_COMMUNITY): Payer: Self-pay

## 2024-02-20 ENCOUNTER — Other Ambulatory Visit (HOSPITAL_COMMUNITY): Payer: Self-pay

## 2024-02-20 MED ORDER — ALLOPURINOL 100 MG PO TABS
200.0000 mg | ORAL_TABLET | Freq: Every day | ORAL | 0 refills | Status: DC
  Filled 2024-02-20: qty 180, 90d supply, fill #0

## 2024-05-10 DIAGNOSIS — E790 Hyperuricemia without signs of inflammatory arthritis and tophaceous disease: Secondary | ICD-10-CM | POA: Diagnosis not present

## 2024-05-10 DIAGNOSIS — M25562 Pain in left knee: Secondary | ICD-10-CM | POA: Diagnosis not present

## 2024-05-10 DIAGNOSIS — I10 Essential (primary) hypertension: Secondary | ICD-10-CM | POA: Diagnosis not present

## 2024-05-10 DIAGNOSIS — Z0000 Encounter for general adult medical examination without abnormal findings: Secondary | ICD-10-CM | POA: Diagnosis not present

## 2024-05-10 DIAGNOSIS — M25561 Pain in right knee: Secondary | ICD-10-CM | POA: Diagnosis not present

## 2024-05-10 DIAGNOSIS — Z6832 Body mass index (BMI) 32.0-32.9, adult: Secondary | ICD-10-CM | POA: Diagnosis not present

## 2024-05-10 DIAGNOSIS — Z23 Encounter for immunization: Secondary | ICD-10-CM | POA: Diagnosis not present

## 2024-05-10 DIAGNOSIS — R7303 Prediabetes: Secondary | ICD-10-CM | POA: Diagnosis not present

## 2024-05-10 DIAGNOSIS — D0511 Intraductal carcinoma in situ of right breast: Secondary | ICD-10-CM | POA: Diagnosis not present

## 2024-05-10 DIAGNOSIS — E559 Vitamin D deficiency, unspecified: Secondary | ICD-10-CM | POA: Diagnosis not present

## 2024-05-22 ENCOUNTER — Other Ambulatory Visit (HOSPITAL_COMMUNITY): Payer: Self-pay

## 2024-05-23 ENCOUNTER — Other Ambulatory Visit: Payer: Self-pay

## 2024-05-23 ENCOUNTER — Other Ambulatory Visit (HOSPITAL_COMMUNITY): Payer: Self-pay

## 2024-05-23 MED ORDER — ALLOPURINOL 100 MG PO TABS
200.0000 mg | ORAL_TABLET | Freq: Every day | ORAL | 1 refills | Status: AC
  Filled 2024-05-23: qty 180, 90d supply, fill #0
  Filled 2024-08-16: qty 180, 90d supply, fill #1

## 2024-05-23 MED ORDER — VITAMIN D (ERGOCALCIFEROL) 1.25 MG (50000 UNIT) PO CAPS
50000.0000 [IU] | ORAL_CAPSULE | ORAL | 1 refills | Status: AC
  Filled 2024-05-23: qty 12, 84d supply, fill #0
  Filled 2024-08-16: qty 12, 84d supply, fill #1

## 2024-05-23 MED ORDER — LISINOPRIL-HYDROCHLOROTHIAZIDE 20-12.5 MG PO TABS
1.0000 | ORAL_TABLET | Freq: Every day | ORAL | 1 refills | Status: AC
  Filled 2024-05-23: qty 90, 90d supply, fill #0
  Filled 2024-08-16: qty 90, 90d supply, fill #1

## 2024-05-26 ENCOUNTER — Emergency Department (HOSPITAL_BASED_OUTPATIENT_CLINIC_OR_DEPARTMENT_OTHER)
Admission: EM | Admit: 2024-05-26 | Discharge: 2024-05-26 | Disposition: A | Discharge status: Home / Self Care | Attending: Emergency Medicine | Admitting: Emergency Medicine

## 2024-05-26 ENCOUNTER — Other Ambulatory Visit: Payer: Self-pay

## 2024-05-26 DIAGNOSIS — R059 Cough, unspecified: Secondary | ICD-10-CM | POA: Diagnosis present

## 2024-05-26 DIAGNOSIS — U071 COVID-19: Secondary | ICD-10-CM | POA: Insufficient documentation

## 2024-05-26 LAB — RESP PANEL BY RT-PCR (RSV, FLU A&B, COVID)  RVPGX2
Influenza A by PCR: NEGATIVE
Influenza B by PCR: NEGATIVE
Resp Syncytial Virus by PCR: NEGATIVE
SARS Coronavirus 2 by RT PCR: POSITIVE — AB

## 2024-05-26 MED ORDER — ACETAMINOPHEN 500 MG PO TABS
1000.0000 mg | ORAL_TABLET | Freq: Once | ORAL | Status: AC
  Administered 2024-05-26: 1000 mg via ORAL
  Filled 2024-05-26: qty 2

## 2024-05-26 NOTE — Discharge Instructions (Signed)
 It was a pleasure taking care of you today. You were seen in the Emergency Department for COVID testing.  Your respiratory panel was positive for COVID-19.  I recommend supportive management with Tylenol  and ibuprofen for body aches and fever.  Refer to the attached documentation for further management of your symptoms. Follow up with your PCP if your symptoms continue or more than 5 days.  Please return to the ER if you experience chest pain, trouble breathing, intractable nausea/vomiting or any other life threatening illnesses.

## 2024-05-26 NOTE — ED Notes (Signed)

## 2024-05-26 NOTE — ED Provider Notes (Signed)
 [place] EMERGENCY DEPARTMENT AT [facility] Provider Note   CSN: [id number] Arrival date & time: 05/26/24  1537     Patient presents with: Cough   [name] is a 63 y.o. female who presents to the emergency department with her husband, who is also a patient, after a positive COVID exposure at work.  She states a coworker was recently diagnosed with COVID.  Patient began having symptoms last Thursday.  She reports a headache, congestion, cough, body aches.  She has been taking Tylenol  over-the-counter for her symptoms.  She is here primarily to obtain a COVID test.   Cough      Prior to Admission medications   Medication Sig Start Date End Date Taking? Authorizing Provider  allopurinol  (ZYLOPRIM ) 100 MG tablet Take 1 tablet (100 mg total) by mouth daily. 05/06/21     allopurinol  (ZYLOPRIM ) 100 MG tablet Take 2 tablets (200 mg total) by mouth daily. 05/11/23     allopurinol  (ZYLOPRIM ) 100 MG tablet Take 2 tablets (200 mg total) by mouth daily. 05/23/24     amoxicillin  (AMOXIL ) 500 MG capsule Take 1 capsule (500 mg total) by mouth every 8 (eight) hours until gone. 10/18/23     anastrozole  (ARIMIDEX ) 1 MG tablet Take 1 tablet (1 mg total) by mouth daily. 11/01/23   Iruku, Praveena, MD  ferrous sulfate 325 (65 FE) MG EC tablet Take 325 mg by mouth every other day.    [provider]  lisinopril -hydrochlorothiazide  (ZESTORETIC ) 20-12.5 MG tablet Take 1 tablet by mouth daily. 05/23/24     Vitamin D , Ergocalciferol , (DRISDOL ) 1.25 MG (50000 UNIT) CAPS capsule Take 1 capsule (50,000 Units total) by mouth once a week 05/23/24       Allergies: Aspirin    Review of Systems  Respiratory:  Positive for cough.     Updated Vital Signs BP (!) 150/71 (BP Location: Right Arm)   Pulse 81   Temp 98.8 F (37.1 C) (Oral)   Resp 18   SpO2 100%   Physical Exam Vitals and nursing note reviewed.  Constitutional:      Appearance: Normal appearance.  HENT:     Head:  Normocephalic and atraumatic.     Mouth/Throat:     Mouth: Mucous membranes are moist.  Eyes:     General: No scleral icterus.       Right eye: No discharge.        Left eye: No discharge.     Conjunctiva/sclera: Conjunctivae normal.  Cardiovascular:     Rate and Rhythm: Normal rate and regular rhythm.     Pulses: Normal pulses.  Pulmonary:     Effort: Pulmonary effort is normal.     Breath sounds: Normal breath sounds.  Abdominal:     General: There is no distension.     Tenderness: There is no abdominal tenderness.  Musculoskeletal:        General: No deformity.     Cervical back: Normal range of motion.  Skin:    General: Skin is warm and dry.     Capillary Refill: Capillary refill takes less than 2 seconds.  Neurological:     Mental Status: She is alert.     Motor: No weakness.  Psychiatric:        Mood and Affect: Mood normal.     (all labs ordered are listed, but only abnormal results are displayed) Labs Reviewed  RESP PANEL BY RT-PCR (RSV, FLU A&B, COVID)  [id number] - Abnormal; Notable for  the following components:      Result Value   SARS Coronavirus 2 by RT PCR POSITIVE (*)    All other components within normal limits    EKG: None  Radiology: No results found.  Procedures   Medications Ordered in the ED  acetaminophen  (TYLENOL ) tablet 1,000 mg (has no administration in time range)                                Medical Decision Making Risk OTC drugs.   This patient presents to the ED for concern of COVID, this involves an extensive number of treatment options, and is a complaint that carries with it a high risk of complications and morbidity.  Differential diagnosis includes: Flu, COVID, RSV, URI  Co morbidities:  none   Lab Tests:  I Ordered, and personally interpreted labs.  The pertinent results include:   - Respiratory panel: COVID-positive  Imaging Studies:  Not indicated   Medicines ordered and prescription drug management:  I  ordered medication including  Medications  acetaminophen  (TYLENOL ) tablet 1,000 mg (has no administration in time range)   for body aches Reevaluation of the patient after these medicines showed that the patient improved I have reviewed the patients home medicines and have made adjustments as needed  Test Considered:   none  Critical Interventions:   none  Consultations Obtained: None  Problem List / ED Course:     ICD-10-CM   1. COVID  U07.1       MDM: Patient is a 64 year old female who presents to the emergency department for evaluation of flulike symptoms.  Patient has a known COVID exposure at work last week.  Respiratory panel shows positive COVID test.  Patient reports body aches so I 1000 mg acetaminophen  ordered.  Patient educated on outpatient supportive management for her symptoms as well as isolation protocol.  Patient verbalized understanding.  Vital signs stable.  Patient stable for discharge at this time.   Dispostion:  After consideration of the diagnostic results and the patients response to treatment, I feel that the patient would benefit from reported management outpatient.   Final diagnoses:  COVID    ED Discharge Orders     None          Torrence Marry RAMAN, PA-C 05/26/24 1656    Pamella Ozell LABOR, DO 05/30/24 [phone]

## 2024-05-26 NOTE — ED Triage Notes (Signed)
 C/o chills, cough, and headache since Thursday. Known exposure to covid. Denies SHOB.

## 2024-06-29 ENCOUNTER — Other Ambulatory Visit (HOSPITAL_COMMUNITY): Payer: Self-pay

## 2024-06-29 DIAGNOSIS — M940 Chondrocostal junction syndrome [Tietze]: Secondary | ICD-10-CM | POA: Diagnosis not present

## 2024-06-29 DIAGNOSIS — J40 Bronchitis, not specified as acute or chronic: Secondary | ICD-10-CM | POA: Diagnosis not present

## 2024-06-29 DIAGNOSIS — R051 Acute cough: Secondary | ICD-10-CM | POA: Diagnosis not present

## 2024-06-29 DIAGNOSIS — J329 Chronic sinusitis, unspecified: Secondary | ICD-10-CM | POA: Diagnosis not present

## 2024-06-29 MED ORDER — AMOXICILLIN-POT CLAVULANATE 875-125 MG PO TABS
1.0000 | ORAL_TABLET | Freq: Two times a day (BID) | ORAL | 0 refills | Status: AC
  Filled 2024-06-29: qty 20, 10d supply, fill #0

## 2024-07-10 ENCOUNTER — Encounter (HOSPITAL_COMMUNITY): Payer: Self-pay

## 2024-07-10 ENCOUNTER — Other Ambulatory Visit (HOSPITAL_COMMUNITY): Payer: Self-pay

## 2024-07-10 DIAGNOSIS — E66811 Obesity, class 1: Secondary | ICD-10-CM | POA: Diagnosis not present

## 2024-07-10 DIAGNOSIS — Z6833 Body mass index (BMI) 33.0-33.9, adult: Secondary | ICD-10-CM | POA: Diagnosis not present

## 2024-07-10 MED ORDER — ZEPBOUND 2.5 MG/0.5ML ~~LOC~~ SOAJ
2.5000 mg | SUBCUTANEOUS | 0 refills | Status: AC
  Filled 2024-07-10 – 2024-08-16 (×2): qty 2, 28d supply, fill #0

## 2024-08-17 ENCOUNTER — Other Ambulatory Visit: Payer: Self-pay

## 2024-08-17 ENCOUNTER — Other Ambulatory Visit (HOSPITAL_COMMUNITY): Payer: Self-pay

## 2024-08-20 ENCOUNTER — Other Ambulatory Visit (HOSPITAL_COMMUNITY): Payer: Self-pay

## 2024-08-21 ENCOUNTER — Other Ambulatory Visit: Payer: Self-pay | Admitting: Family Medicine

## 2024-08-21 ENCOUNTER — Other Ambulatory Visit: Payer: Self-pay | Admitting: Hematology and Oncology

## 2024-08-21 DIAGNOSIS — Z9889 Other specified postprocedural states: Secondary | ICD-10-CM

## 2024-09-06 ENCOUNTER — Ambulatory Visit
Admission: RE | Admit: 2024-09-06 | Discharge: 2024-09-06 | Disposition: A | Discharge status: Home / Self Care | Source: Ambulatory Visit | Attending: Hematology and Oncology | Admitting: Hematology and Oncology

## 2024-09-06 DIAGNOSIS — Z9889 Other specified postprocedural states: Secondary | ICD-10-CM
